# Patient Record
Sex: Female | Born: 1965 | Race: White | Hispanic: No | Marital: Married | Smoking: Former smoker
Health system: Southern US, Community
[De-identification: ages and names within clinical notes are randomized; demographics above are authoritative.]

## PROBLEM LIST (undated history)

## (undated) DIAGNOSIS — I82409 Acute embolism and thrombosis of unspecified deep veins of unspecified lower extremity: Secondary | ICD-10-CM

## (undated) DIAGNOSIS — K219 Gastro-esophageal reflux disease without esophagitis: Secondary | ICD-10-CM

## (undated) DIAGNOSIS — N200 Calculus of kidney: Secondary | ICD-10-CM

## (undated) DIAGNOSIS — F329 Major depressive disorder, single episode, unspecified: Secondary | ICD-10-CM

## (undated) DIAGNOSIS — G629 Polyneuropathy, unspecified: Secondary | ICD-10-CM

## (undated) DIAGNOSIS — R2689 Other abnormalities of gait and mobility: Secondary | ICD-10-CM

## (undated) DIAGNOSIS — R42 Dizziness and giddiness: Secondary | ICD-10-CM

## (undated) DIAGNOSIS — IMO0002 Reserved for concepts with insufficient information to code with codable children: Secondary | ICD-10-CM

## (undated) DIAGNOSIS — F419 Anxiety disorder, unspecified: Secondary | ICD-10-CM

## (undated) HISTORY — DX: Major depressive disorder, single episode, unspecified: F32.9

## (undated) HISTORY — PX: LITHOTRIPSY: SUR834

## (undated) HISTORY — DX: Calculus of kidney: N20.0

## (undated) HISTORY — DX: Reserved for concepts with insufficient information to code with codable children: IMO0002

## (undated) HISTORY — DX: Polyneuropathy, unspecified: G62.9

## (undated) HISTORY — DX: Gastro-esophageal reflux disease without esophagitis: K21.9

## (undated) HISTORY — DX: Anxiety disorder, unspecified: F41.9

---

## 2012-10-27 ENCOUNTER — Other Ambulatory Visit: Payer: Self-pay | Admitting: Family Medicine

## 2012-10-27 DIAGNOSIS — R1011 Right upper quadrant pain: Secondary | ICD-10-CM

## 2012-10-28 ENCOUNTER — Other Ambulatory Visit: Payer: Self-pay

## 2012-11-13 ENCOUNTER — Ambulatory Visit
Admission: RE | Admit: 2012-11-13 | Discharge: 2012-11-13 | Disposition: A | Discharge status: Home / Self Care | Payer: Self-pay | Source: Ambulatory Visit | Attending: Family Medicine | Admitting: Family Medicine

## 2012-11-13 DIAGNOSIS — R1011 Right upper quadrant pain: Secondary | ICD-10-CM

## 2013-01-27 ENCOUNTER — Other Ambulatory Visit (HOSPITAL_COMMUNITY)
Admission: RE | Admit: 2013-01-27 | Discharge: 2013-01-27 | Disposition: A | Discharge status: Home / Self Care | Payer: BC Managed Care – PPO | Source: Ambulatory Visit | Attending: Obstetrics and Gynecology | Admitting: Obstetrics and Gynecology

## 2013-01-27 ENCOUNTER — Other Ambulatory Visit: Payer: Self-pay | Admitting: Nurse Practitioner

## 2013-01-27 DIAGNOSIS — Z1151 Encounter for screening for human papillomavirus (HPV): Secondary | ICD-10-CM | POA: Insufficient documentation

## 2013-01-27 DIAGNOSIS — Z01419 Encounter for gynecological examination (general) (routine) without abnormal findings: Secondary | ICD-10-CM | POA: Insufficient documentation

## 2013-01-27 DIAGNOSIS — Z113 Encounter for screening for infections with a predominantly sexual mode of transmission: Secondary | ICD-10-CM | POA: Insufficient documentation

## 2013-01-29 ENCOUNTER — Other Ambulatory Visit: Payer: Self-pay

## 2013-01-29 DIAGNOSIS — Z1231 Encounter for screening mammogram for malignant neoplasm of breast: Secondary | ICD-10-CM

## 2013-03-11 ENCOUNTER — Ambulatory Visit
Admission: RE | Admit: 2013-03-11 | Discharge: 2013-03-11 | Disposition: A | Discharge status: Home / Self Care | Payer: BC Managed Care – PPO | Source: Ambulatory Visit

## 2013-03-11 DIAGNOSIS — Z1231 Encounter for screening mammogram for malignant neoplasm of breast: Secondary | ICD-10-CM

## 2013-04-20 ENCOUNTER — Encounter: Payer: Self-pay | Admitting: Neurology

## 2013-04-21 ENCOUNTER — Ambulatory Visit: Payer: BC Managed Care – PPO | Admitting: Neurology

## 2013-05-01 ENCOUNTER — Ambulatory Visit: Payer: BC Managed Care – PPO | Admitting: Neurology

## 2014-02-16 ENCOUNTER — Other Ambulatory Visit: Payer: Self-pay

## 2014-03-04 ENCOUNTER — Encounter (INDEPENDENT_AMBULATORY_CARE_PROVIDER_SITE_OTHER): Payer: Self-pay

## 2014-03-04 ENCOUNTER — Ambulatory Visit (INDEPENDENT_AMBULATORY_CARE_PROVIDER_SITE_OTHER): Payer: BC Managed Care – PPO | Admitting: Neurology

## 2014-03-04 ENCOUNTER — Encounter: Payer: Self-pay | Admitting: Neurology

## 2014-03-04 VITALS — BP 141/90 | HR 83 | Ht 65.0 in | Wt 159.0 lb

## 2014-03-04 DIAGNOSIS — R209 Unspecified disturbances of skin sensation: Secondary | ICD-10-CM

## 2014-03-04 DIAGNOSIS — R413 Other amnesia: Secondary | ICD-10-CM

## 2014-03-04 NOTE — Progress Notes (Signed)
Reason for visit: Numbness, weakness, memory problems  Kristin [name] is a 48 y.o. female  History of present illness:  [name] is a 48 year old left-handed white female with a history of major depression. The patient has required hospitalization in the past for her depression. Over the last several years, she believes that her depression has worsened over time. The patient recently was placed on Abilify, and she has been on Lamictal and Depakote. The patient has been on Depakote a number of years, and she has developed tremors associated with this. Within the last 6-12 months, she believes that there has been some issues with numbness and a sensation of weakness in arms and legs. The patient has numbness from the knees down to the feet, and in the hands. The patient feels that there has been some issues with balance, but she denies any falls. The patient has had issues with dropping things from her hands. The patient reports some joint pain, particularly in the knees. The patient denies problems controlling the bowels or the bladder. The patient does report some low back pain, and she also has some neck discomfort. The patient has had some issues with difficulty with concentration and confusion for several months. The patient has difficulty with directions while driving, and she has had problems keeping a full schedule while at work. The patient does report some issues with insomnia at times, and she has fatigue during the day. She is sent to this office for further evaluation. She reports that she has had recent blood work done for her annual blood work, no abnormalities were noted.  Past Medical History  Diagnosis Date  . Bilateral posterior subcapsular polar cataract   . Depression, major   . Anxiety   . GERD (gastroesophageal reflux disease)   . Renal calculi     Past Surgical History  Procedure Laterality Date  . Cesarean section    . Lithotripsy      Family History  Problem  Relation Age of Onset  . Depression Sister   . Cancer Sister     thyroid    Social history:  reports that she has never smoked. She has never used smokeless tobacco. She reports that she drinks alcohol. She reports that she does not use illicit drugs.  Medications:  Current Outpatient Prescriptions on File Prior to Visit  Medication Sig Dispense Refill  . clonazePAM (KLONOPIN) 0.5 MG tablet Take 0.5 mg by mouth 2 (two) times daily.       . divalproex (DEPAKOTE ER) 500 MG 24 hr tablet Take 1,000 mg by mouth daily.        No current facility-administered medications on file prior to visit.     No Known Allergies  ROS:  Out of a complete 14 system review of symptoms, the patient complains only of the following symptoms, and all other reviewed systems are negative.  Weight loss, fatigue Chest pains Ringing in the ears, difficulty swallowing Itching, moles Blurred vision Shortness of breath, cough Constipation Blood in the urine Feeling cold, joint pain Runny nose, skin sensitivity Confusion, headache, numbness, weakness, slurred speech, difficulty swallowing, dizziness, tremor Depression, anxiety, insomnia, decreased energy  Blood pressure 141/90, pulse 83, height 5\' 5"  (1.651 m), weight 159 lb (72.122 kg).  Physical Exam  General: The patient is alert and cooperative at the time of the examination.  Eyes: Pupils are equal, round, and reactive to light. Discs are flat bilaterally.  Neck: The neck is supple, no carotid bruits are  noted.  Respiratory: The respiratory examination is clear.  Cardiovascular: The cardiovascular examination reveals a regular rate and rhythm, no obvious murmurs or rubs are noted.  Skin: Extremities are without significant edema.  Neurologic Exam  Mental status: The patient is alert and oriented x 3 at the time of the examination. The patient has apparent normal recent and remote memory, with an apparently normal attention span and  concentration ability. Mini-Mental status examination done today shows a total score 29/30.  Cranial nerves: Facial symmetry is present. There is good sensation of the face to pinprick and soft touch bilaterally. The strength of the facial muscles and the muscles to head turning and shoulder shrug are normal bilaterally. Speech is well enunciated, no aphasia or dysarthria is noted. Extraocular movements are full. Visual fields are full. The tongue is midline, and the patient has symmetric elevation of the soft palate. No obvious hearing deficits are noted.  Motor: The motor testing reveals 5 over 5 strength of all 4 extremities. Good symmetric motor tone is noted throughout.  Sensory: Sensory testing is intact to pinprick, soft touch, vibration sensation, and position sense on all 4 extremities, with exception that there may be some slight decrease in position sense with the right foot. No definite stocking pattern pinprick sensory deficit was noted. No evidence of extinction is noted.  Coordination: Cerebellar testing reveals good finger-nose-finger and heel-to-shin bilaterally. A mild intention tremor seen with finger-nose-finger bilaterally.  Gait and station: Gait is normal. Tandem gait is normal. Romberg is negative. No drift is seen.  Reflexes: Deep tendon reflexes are symmetric and normal bilaterally. Toes are downgoing bilaterally.   Assessment/Plan:  One. Reported memory disturbance  2. Major depression  3. Reported numbness, weakness  4. Tremors, likely related to Depakote  The patient has a history of major depression, and she believes that her depression has worsened recently. The patient has some issues with memory and concentration, and it may be related to depression. The patient also reports some troubles with numbness and mild gait instability. We will pursue a further workup to include blood work and MRI of the brain to exclude demyelinating disease. The patient is on  Depakote, and the ammonia level does need to be checked for this reason. The patient will followup in 4 months.  Kristin [name]. Keith Willis MD 03/04/2014 10:35 AM  [facility] [address] [place], [place] [id number]  Phone [phone] Fax [phone]

## 2014-03-06 LAB — VALPROIC ACID LEVEL: Valproic Acid Lvl: 91 ug/mL (ref 50–100)

## 2014-03-06 LAB — HIV ANTIBODY (ROUTINE TESTING W REFLEX)
HIV 1/O/2 Abs-Index Value: <1 (ref <1.00)
HIV-1/HIV-2 Ab: NONREACTIVE

## 2014-03-06 LAB — LYME, TOTAL AB TEST/REFLEX: Lyme IgG/IgM Ab: <0.91 {ISR} (ref 0.00–0.90)

## 2014-03-06 LAB — ANGIOTENSIN CONVERTING ENZYME: Angio Convert Enzyme: 44 U/L (ref 14–82)

## 2014-03-06 LAB — AMMONIA: Ammonia: 56 ug/dL (ref 19–87)

## 2014-03-06 LAB — COPPER, SERUM: Copper: 117 ug/dL (ref 72–166)

## 2014-03-06 LAB — ANA W/REFLEX: Anti Nuclear Antibody(ANA): NEGATIVE

## 2014-03-06 LAB — VITAMIN B12: Vitamin B-12: 1353 pg/mL — ABNORMAL HIGH (ref 211–946)

## 2014-03-06 LAB — RHEUMATOID FACTOR: Rhuematoid fact SerPl-aCnc: 11.9 IU/mL (ref 0.0–13.9)

## 2014-03-06 LAB — SEDIMENTATION RATE: Sed Rate: 2 mm/hr (ref 0–32)

## 2014-03-08 NOTE — Progress Notes (Signed)
Quick Note:  Spoke with patient and shared unremarkable results, verbalized understanding and requested a copy mailed to her home address. Copy mailed ______

## 2014-03-23 ENCOUNTER — Other Ambulatory Visit: Payer: Self-pay

## 2014-03-23 DIAGNOSIS — Z1231 Encounter for screening mammogram for malignant neoplasm of breast: Secondary | ICD-10-CM

## 2014-04-06 ENCOUNTER — Ambulatory Visit: Payer: BC Managed Care – PPO

## 2014-04-08 ENCOUNTER — Other Ambulatory Visit: Payer: Self-pay | Admitting: Gastroenterology

## 2014-04-08 DIAGNOSIS — R1013 Epigastric pain: Secondary | ICD-10-CM

## 2014-04-08 DIAGNOSIS — R110 Nausea: Secondary | ICD-10-CM

## 2014-04-27 ENCOUNTER — Ambulatory Visit (HOSPITAL_COMMUNITY)
Admission: RE | Admit: 2014-04-27 | Discharge: 2014-04-27 | Disposition: A | Discharge status: Home / Self Care | Payer: BC Managed Care – PPO | Source: Ambulatory Visit | Attending: Gastroenterology | Admitting: Gastroenterology

## 2014-04-27 DIAGNOSIS — R1013 Epigastric pain: Secondary | ICD-10-CM | POA: Insufficient documentation

## 2014-04-27 DIAGNOSIS — R110 Nausea: Secondary | ICD-10-CM | POA: Insufficient documentation

## 2014-04-27 MED ORDER — TECHNETIUM TC 99M MEBROFENIN IV KIT: 5.0000 | PACK | Freq: Once | INTRAVENOUS | Status: AC | PRN

## 2014-04-27 MED ORDER — SINCALIDE 5 MCG IJ SOLR
0.0200 ug/kg | Freq: Once | INTRAMUSCULAR | Status: AC
  Administered 2014-04-27: 1.4 ug via INTRAVENOUS

## 2014-07-05 ENCOUNTER — Ambulatory Visit: Payer: BC Managed Care – PPO | Admitting: Nurse Practitioner

## 2014-07-07 ENCOUNTER — Ambulatory Visit: Payer: Self-pay | Admitting: Adult Health

## 2014-07-10 ENCOUNTER — Telehealth: Payer: Self-pay | Admitting: Gastroenterology

## 2014-07-10 NOTE — Telephone Encounter (Signed)
Had colonsocopy EGD with Dr. Loreta Ave yesterday.  Was given information on diverticulosis.  Has had nausea, abd pains yesterday.  Mid abd pain, tender to touch.  Felt a bit better with BM recently.  Doesn't feel feverish.  She did not receive any copies of her procedure.  She is very clear that her GI symptoms were present even before her procedures and were the reason she had the tests.    Very unlikely any complication from egd/colonoscopy. She will follow up with Dr. Loreta Ave next week or the week after.

## 2014-08-09 ENCOUNTER — Ambulatory Visit: Payer: BC Managed Care – PPO

## 2014-09-16 ENCOUNTER — Institutional Professional Consult (permissible substitution): Payer: BC Managed Care – PPO | Admitting: Emergency Medicine

## 2014-10-06 ENCOUNTER — Ambulatory Visit (INDEPENDENT_AMBULATORY_CARE_PROVIDER_SITE_OTHER): Payer: BC Managed Care – PPO | Admitting: Adult Health

## 2014-10-06 ENCOUNTER — Encounter: Payer: Self-pay | Admitting: Adult Health

## 2014-10-06 VITALS — BP 111/76 | HR 96 | Temp 97.7°F | Ht 65.0 in | Wt 146.0 lb

## 2014-10-06 DIAGNOSIS — R209 Unspecified disturbances of skin sensation: Secondary | ICD-10-CM

## 2014-10-06 DIAGNOSIS — R42 Dizziness and giddiness: Secondary | ICD-10-CM

## 2014-10-06 NOTE — Progress Notes (Signed)
PATIENT: Kristin [name] DOB: [birth year]  REASON FOR VISIT: follow up HISTORY FROM: patient  HISTORY OF PRESENT ILLNESS: Kristin [name] is a 48 year old female with a history of major depression, reported numbness and weakness and tremor. She returns today for follow-up. At her previous visit she had lab work which was unremarkable. An MRI of the brain was ordered for the patient however she has not had this done. She report that her PCP did additional blood work which was unremarkable. The patient reports that in the last two months she has begin to have dizziness. She notices it when she stands to walk. She notices it more when she changes positions but it has occurred when she is just  Sitting. She reports that she has been having some GI issues and therefore her appetite has not been great. She is unsure of her dizziness is due to that.  She also feels like she is "walking on clouds" at times. she also reports that she has had a pain in the back of her head that feels like a burning sensation. She reports that it has been constant has the day goes on. She feels like her muscles are weaker. She feels like if she stands for a period of time, her legs are going to give out if she doesn't sit down. Denies any falls. She is currently taking Depakote and Lamictal for her depression. She takes clonazepam for anxiety.                                         HISTORY 03/04/14 (WILLIS): 48 year old left-handed white female with a history of major depression. The patient has required hospitalization in the past for her depression. Over the last several years, she believes that her depression has worsened over time. The patient recently was placed on Abilify, and she has been on Lamictal and Depakote. The patient has been on Depakote a number of years, and she has developed tremors associated with this. Within the last 6-12 months, she believes that there has been some issues with numbness and a sensation of weakness in  arms and legs. The patient has numbness from the knees down to the feet, and in the hands. The patient feels that there has been some issues with balance, but she denies any falls. The patient has had issues with dropping things from her hands. The patient reports some joint pain, particularly in the knees. The patient denies problems controlling the bowels or the bladder. The patient does report some low back pain, and she also has some neck discomfort. The patient has had some issues with difficulty with concentration and confusion for several months. The patient has difficulty with directions while driving, and she has had problems keeping a full schedule while at work. The patient does report some issues with insomnia at times, and she has fatigue during the day. She is sent to this office for further evaluation. She reports that she has had recent blood work done for her annual blood work, no abnormalities were noted  REVIEW OF SYSTEMS: Out of a complete 14 system review of symptoms, the patient complains only of the following symptoms, and all other reviewed systems are negative.  Chills, fatigue, unexpected weight change Runny nose, trouble swallowing Eye itching Shortness of breath Palpitations Excessive thirst Abdominal pain, nausea Frequent waking, daytime sleepiness Frequency of urination, blood in urine Joint pain, back  pain, walking difficulty, neck pain, neck stiffness Moles, itching Dizziness, headache, numbness, speech difficulty, weakness, tremors Confusion, nervous/anxious  ALLERGIES: No Known Allergies  HOME MEDICATIONS: Outpatient Prescriptions Prior to Visit  Medication Sig Dispense Refill  . clonazePAM (KLONOPIN) 0.5 MG tablet Take 0.5 mg by mouth 2 (two) times daily.     . divalproex (DEPAKOTE ER) 500 MG 24 hr tablet Take 1,000 mg by mouth daily.     Marland Kitchen. lamoTRIgine (LAMICTAL) 100 MG tablet Take 150 mg by mouth daily.     . ARIPiprazole (ABILIFY) 10 MG tablet Take 10  mg by mouth daily.    Marland Kitchen. omeprazole (PRILOSEC) 20 MG capsule Take 20 mg by mouth daily.     No facility-administered medications prior to visit.    PAST MEDICAL HISTORY: Past Medical History  Diagnosis Date  . Bilateral posterior subcapsular polar cataract   . Depression, major   . Anxiety   . GERD (gastroesophageal reflux disease)   . Renal calculi   . Neuropathy     PAST SURGICAL HISTORY: Past Surgical History  Procedure Laterality Date  . Cesarean section    . Lithotripsy      FAMILY HISTORY: Family History  Problem Relation Age of Onset  . Depression Sister   . Cancer Sister     thyroid    SOCIAL HISTORY: History   Social History  . Marital Status: Divorced    Spouse Name: N/A    Number of Children: 1  . Years of Education: MA   Occupational History  . Teacher    Social History Main Topics  . Smoking status: Never Smoker   . Smokeless tobacco: Never Used  . Alcohol Use: Yes     Comment: rarely  . Drug Use: No  . Sexual Activity: Not on file   Other Topics Concern  . Not on file   Social History Narrative      PHYSICAL EXAM  Filed Vitals:   10/06/14 1030  BP: 111/76  Pulse: 96  Temp: 97.7 F (36.5 C)  TempSrc: Oral  Height: 5\' 5"  (1.651 m)  Weight: 146 lb (66.225 kg)   Body mass index is 24.3 kg/(m^2).  Generalized: Well developed, in no acute distress   Neurological examination  Mentation: Alert oriented to time, place, history taking. Follows all commands speech and language fluent Cranial nerve II-XII: Pupils were equal round reactive to light. Extraocular movements were full, visual field were full on confrontational test. Facial sensation and strength were normal. Uvula tongue midline. Head turning and shoulder shrug  were normal and symmetric. Motor: The motor testing reveals 5 over 5 strength of all 4 extremities. Good symmetric motor tone is noted throughout.  Sensory: Sensory testing is intact to soft touch on all 4  extremities. No evidence of extinction is noted.  Coordination: Cerebellar testing reveals good finger-nose-finger and heel-to-shin bilaterally.  Gait and station: Gait is normal. Tandem gait is normal. Romberg is negative. No drift is seen.  Reflexes: Deep tendon reflexes are symmetric and normal bilaterally.     DIAGNOSTIC DATA (LABS, IMAGING, TESTING) - I reviewed patient records, labs, notes, testing and imaging myself where available.   Lab Results  Component Value Date   VITAMINB12 1353* 03/04/2014      ASSESSMENT AND PLAN 48 y.o. year old female  has a past medical history of Bilateral posterior subcapsular polar cataract; Depression, major; Anxiety; GERD (gastroesophageal reflux disease); Renal calculi; and Neuropathy. here with:  1. Depression 2. Reported numbness and weakness  3. Dizziness  Patient returns today. She continues to have the numbness and weakness in the legs. However her physical exam is unremarkable. The patient is also having a new symptom of dizziness. She notices this primarily with position changes. I have advised the patient that she needs to get the MRI of the brain. Patient verbalized understanding. Once she has the MRI we will call her with the results. If the patient's symptoms worsen or she develops new symptoms she should let us know. Otherwise she'll follow up in 3 months.  Butch Penny, MSN, NP-C 10/06/2014, 10:47 AM [facility] [address], [address] [place], [place] [address] [phone]  Note: This document was prepared with digital dictation and possible smart phrase technology. Any transcriptional errors that result from this process are unintentional.

## 2014-10-06 NOTE — Progress Notes (Signed)
I have read the note, and I agree with the clinical assessment and plan.  Anagabriela Jokerst KEITH   

## 2014-10-06 NOTE — Patient Instructions (Signed)
Please call and scheduled your MRI of the brain. We will call you once the results are available to us.

## 2014-10-12 ENCOUNTER — Inpatient Hospital Stay: Admission: RE | Admit: 2014-10-12 | Payer: BC Managed Care – PPO | Source: Ambulatory Visit

## 2014-10-13 ENCOUNTER — Other Ambulatory Visit: Payer: Self-pay | Admitting: Family Medicine

## 2014-10-13 DIAGNOSIS — N644 Mastodynia: Secondary | ICD-10-CM

## 2014-11-02 ENCOUNTER — Ambulatory Visit
Admission: RE | Admit: 2014-11-02 | Discharge: 2014-11-02 | Disposition: A | Discharge status: Home / Self Care | Payer: BC Managed Care – PPO | Source: Ambulatory Visit | Attending: Family Medicine | Admitting: Family Medicine

## 2014-11-02 DIAGNOSIS — N644 Mastodynia: Secondary | ICD-10-CM

## 2014-11-03 ENCOUNTER — Ambulatory Visit (INDEPENDENT_AMBULATORY_CARE_PROVIDER_SITE_OTHER): Payer: BC Managed Care – PPO

## 2014-11-03 ENCOUNTER — Other Ambulatory Visit: Payer: BC Managed Care – PPO

## 2014-11-03 DIAGNOSIS — R413 Other amnesia: Secondary | ICD-10-CM

## 2014-11-03 DIAGNOSIS — R209 Unspecified disturbances of skin sensation: Secondary | ICD-10-CM

## 2014-11-03 MED ORDER — GADOPENTETATE DIMEGLUMINE 469.01 MG/ML IV SOLN: 13.0000 mL | Freq: Once | INTRAVENOUS | Status: AC | PRN

## 2014-11-06 ENCOUNTER — Telehealth: Payer: Self-pay | Admitting: Neurology

## 2014-11-06 NOTE — Telephone Encounter (Signed)
I called the patient. MRI of the brain is normal.   MRI brain 11/04/14:  IMPRESSION:  Normal MRI brain (with and without).

## 2014-11-16 ENCOUNTER — Telehealth: Payer: Self-pay | Admitting: Adult Health

## 2014-11-16 NOTE — Telephone Encounter (Signed)
Mri results

## 2014-11-16 NOTE — Telephone Encounter (Signed)
Patient calling for MRI results.  Please call and advise. °

## 2014-11-16 NOTE — Telephone Encounter (Signed)
Called patient no answer.

## 2014-11-16 NOTE — Telephone Encounter (Signed)
MRI of the brain was normal. Please call and let the patient know.

## 2014-11-17 ENCOUNTER — Encounter: Payer: Self-pay | Admitting: *Deleted

## 2015-01-06 ENCOUNTER — Ambulatory Visit: Payer: BC Managed Care – PPO | Admitting: Adult Health

## 2015-02-04 ENCOUNTER — Ambulatory Visit (INDEPENDENT_AMBULATORY_CARE_PROVIDER_SITE_OTHER): Payer: BC Managed Care – PPO | Admitting: Adult Health

## 2015-02-04 ENCOUNTER — Encounter: Payer: Self-pay | Admitting: Adult Health

## 2015-02-04 VITALS — BP 115/72 | HR 88 | Ht 65.0 in | Wt 140.0 lb

## 2015-02-04 DIAGNOSIS — R251 Tremor, unspecified: Secondary | ICD-10-CM

## 2015-02-04 DIAGNOSIS — F32A Depression, unspecified: Secondary | ICD-10-CM

## 2015-02-04 DIAGNOSIS — F329 Major depressive disorder, single episode, unspecified: Secondary | ICD-10-CM

## 2015-02-04 DIAGNOSIS — R200 Anesthesia of skin: Secondary | ICD-10-CM | POA: Diagnosis not present

## 2015-02-04 NOTE — Patient Instructions (Signed)
If numbness in feet worsen or you develop burning or tingling pain let us know.  Follow-up with PCP/ENT for sinus and dizziness

## 2015-02-04 NOTE — Progress Notes (Signed)
PATIENT: Kristin [name] DOB: [birth year]  REASON FOR VISIT: follow up HISTORY FROM: patient  HISTORY OF PRESENT ILLNESS: Kristin [name] is a 49 -year-old female with a history of major depression, numbness and weakness and tremor. She returns today for follow-up. The patient reports that she has numbness in the left foot that she mainly notices at night. She describes it as a sensation that her foot is swollen but it in fact is not swollen. She states that this it is intermittent. Denies any burning or tingling. She denies any numbness or burning or tingling in the right foot or leg. She states that she will occasionally have issues with the left hand when writing. She contributes this to a tremor that is from the Depakote. The patient is currently being followed by her primary care provider for sinus issues as well as shortness of breath and dizziness. The patient states this is been ongoing for 2-3 months. The patient plans to get a referral to ENT for evaluation. She denies any new neurological symptoms. She returns today for an evaluation.  HISTORY 10/06/14: KristinLevy is a 49 year old female with a history of major depression, reported numbness and weakness and tremor. She returns today for follow-up. At her previous visit she had lab work which was unremarkable. An MRI of the brain was ordered for the patient however she has not had this done. She report that her PCP did additional blood work which was unremarkable. The patient reports that in the last two months she has begin to have dizziness. She notices it when she stands to walk. She notices it more when she changes positions but it has occurred when she is just Sitting. She reports that she has been having some GI issues and therefore her appetite has not been great. She is unsure of her dizziness is due to that. She also feels like she is "walking on clouds" at times. she also reports that she has had a pain in the back of her head that feels  like a burning sensation. She reports that it has been constant has the day goes on. She feels like her muscles are weaker. She feels like if she stands for a period of time, her legs are going to give out if she doesn't sit down. Denies any falls. She is currently taking Depakote and Lamictal for her depression. She takes clonazepam for anxiety.    HISTORY 03/04/14 (WILLIS): 49 year old left-handed white female with a history of major depression. The patient has required hospitalization in the past for her depression. Over the last several years, she believes that her depression has worsened over time. The patient recently was placed on Abilify, and she has been on Lamictal and Depakote. The patient has been on Depakote a number of years, and she has developed tremors associated with this. Within the last 6-12 months, she believes that there has been some issues with numbness and a sensation of weakness in arms and legs. The patient has numbness from the knees down to the feet, and in the hands. The patient feels that there has been some issues with balance, but she denies any falls. The patient has had issues with dropping things from her hands. The patient reports some joint pain, particularly in the knees. The patient denies problems controlling the bowels or the bladder. The patient does report some low back pain, and she also has some neck discomfort. The patient has had some issues with difficulty with concentration and confusion for several  months. The patient has difficulty with directions while driving, and she has had problems keeping a full schedule while at work. The patient does report some issues with insomnia at times, and she has fatigue during the day. She is sent to this office for further evaluation. She reports that she has had recent blood work done for her annual blood work, no abnormalities were noted  REVIEW OF SYSTEMS: Out of a complete 14 system  review of symptoms, the patient complains only of the following symptoms, and all other reviewed systems are negative.  Appetite change, runny nose, trouble swallowing, drooling, cough, shortness of breath, chest tightness, chest pain, palpitations, daytime sleepiness, back pain, neck pain, neck stiffness, speech difficulty  ALLERGIES: No Known Allergies  HOME MEDICATIONS: Outpatient Prescriptions Prior to Visit  Medication Sig Dispense Refill  . buPROPion (WELLBUTRIN XL) 150 MG 24 hr tablet Take 150 mg by mouth daily.    . clonazePAM (KLONOPIN) 0.5 MG tablet Take 0.5 mg by mouth 2 (two) times daily.     . divalproex (DEPAKOTE ER) 500 MG 24 hr tablet Take 1,000 mg by mouth daily.     Marland Kitchen. docusate sodium (COLACE) 100 MG capsule Take 100 mg by mouth 2 (two) times daily.    Marland Kitchen. lamoTRIgine (LAMICTAL) 100 MG tablet Take 150 mg by mouth daily.     . ondansetron (ZOFRAN) 4 MG tablet Take 4 mg by mouth every 8 (eight) hours as needed for nausea or vomiting.    . ranitidine (ZANTAC) 150 MG tablet Take 150 mg by mouth 2 (two) times daily.    . ARIPiprazole (ABILIFY) 10 MG tablet Take 10 mg by mouth daily.    Marland Kitchen. omeprazole (PRILOSEC) 20 MG capsule Take 20 mg by mouth daily.     No facility-administered medications prior to visit.    PAST MEDICAL HISTORY: Past Medical History  Diagnosis Date  . Bilateral posterior subcapsular polar cataract   . Depression, major   . Anxiety   . GERD (gastroesophageal reflux disease)   . Renal calculi   . Neuropathy     PAST SURGICAL HISTORY: Past Surgical History  Procedure Laterality Date  . Cesarean section    . Lithotripsy      FAMILY HISTORY: Family History  Problem Relation Age of Onset  . Depression Sister   . Cancer Sister     thyroid    SOCIAL HISTORY: History   Social History  . Marital Status: Divorced    Spouse Name: N/A  . Number of Children: 1  . Years of Education: MA   Occupational History  . Teacher    Social History Main  Topics  . Smoking status: Never Smoker   . Smokeless tobacco: Never Used  . Alcohol Use: No     Comment: rarely  . Drug Use: No  . Sexual Activity: Not on file   Other Topics Concern  . Not on file   Social History Narrative      PHYSICAL EXAM  Filed Vitals:   02/04/15 0946  BP: 115/72  Pulse: 88  Height: 5\' 5"  (1.651 m)  Weight: 140 lb (63.504 kg)   Body mass index is 23.3 kg/(m^2).  Generalized: Well developed, in no acute distress   Neurological examination  Mentation: Alert oriented to time, place, history taking. Follows all commands speech and language fluent Cranial nerve II-XII: Pupils were equal round reactive to light. Extraocular movements were full, visual field were full on confrontational test. Facial sensation and strength were  normal. Uvula tongue midline. Head turning and shoulder shrug  were normal and symmetric. Motor: The motor testing reveals 5 over 5 strength of all 4 extremities. Good symmetric motor tone is noted throughout.  Sensory: Sensory testing is intact to soft touch on all 4 extremities. No evidence of extinction is noted.  Coordination: Cerebellar testing reveals good finger-nose-finger and heel-to-shin bilaterally.  Gait and station: Gait is normal. Tandem gait is normal. Romberg is negative. No drift is seen.  Reflexes: Deep tendon reflexes are symmetric and normal bilaterally.  Marland Kitchen   DIAGNOSTIC DATA (LABS, IMAGING, TESTING) - I reviewed patient records, labs, notes, testing and imaging myself where available.     ASSESSMENT AND PLAN 49 y.o. year old female  has a past medical history of Bilateral posterior subcapsular polar cataract; Depression, major; Anxiety; GERD (gastroesophageal reflux disease); Renal calculi; and Neuropathy. here with:  1. Numbness 2. Tremor 3. Depression  The patient reports intermittent numbness in the left foot noticed mainly at bedtime. The patient has had a thorough workup in the past that was  unremarkable. The numbness is most likely benign. She continues to have a tremor in the left hand was likely associated with Depakote. The patient continues to be treated with Depakote and Lamictal for depression. Patient advised that if the numbness increases or she develops burning and tingling sensations she should let us know. Otherwise the patient can follow up as needed.   Butch Penny, MSN, NP-C 02/04/2015, 9:55 AM [facility] [address], [address] [place], [place] [address] [phone]  Note: This document was prepared with digital dictation and possible smart phrase technology. Any transcriptional errors that result from this process are unintentional.

## 2015-02-04 NOTE — Progress Notes (Signed)
I have read the note, and I agree with the clinical assessment and plan.  Olivier Frayre KEITH   

## 2015-04-26 ENCOUNTER — Encounter (HOSPITAL_BASED_OUTPATIENT_CLINIC_OR_DEPARTMENT_OTHER): Payer: Self-pay | Admitting: *Deleted

## 2015-04-26 ENCOUNTER — Emergency Department (HOSPITAL_BASED_OUTPATIENT_CLINIC_OR_DEPARTMENT_OTHER)
Admission: EM | Admit: 2015-04-26 | Discharge: 2015-04-27 | Disposition: A | Discharge status: Home / Self Care | Payer: BC Managed Care – PPO | Attending: Emergency Medicine | Admitting: Emergency Medicine

## 2015-04-26 DIAGNOSIS — Z720 Tobacco use: Secondary | ICD-10-CM | POA: Diagnosis not present

## 2015-04-26 DIAGNOSIS — R200 Anesthesia of skin: Secondary | ICD-10-CM | POA: Diagnosis not present

## 2015-04-26 DIAGNOSIS — Z87442 Personal history of urinary calculi: Secondary | ICD-10-CM | POA: Diagnosis not present

## 2015-04-26 DIAGNOSIS — R2689 Other abnormalities of gait and mobility: Secondary | ICD-10-CM

## 2015-04-26 DIAGNOSIS — R29818 Other symptoms and signs involving the nervous system: Secondary | ICD-10-CM | POA: Diagnosis not present

## 2015-04-26 DIAGNOSIS — Z79899 Other long term (current) drug therapy: Secondary | ICD-10-CM | POA: Insufficient documentation

## 2015-04-26 DIAGNOSIS — F322 Major depressive disorder, single episode, severe without psychotic features: Secondary | ICD-10-CM | POA: Insufficient documentation

## 2015-04-26 DIAGNOSIS — K219 Gastro-esophageal reflux disease without esophagitis: Secondary | ICD-10-CM | POA: Diagnosis not present

## 2015-04-26 DIAGNOSIS — Z8669 Personal history of other diseases of the nervous system and sense organs: Secondary | ICD-10-CM | POA: Insufficient documentation

## 2015-04-26 DIAGNOSIS — F419 Anxiety disorder, unspecified: Secondary | ICD-10-CM | POA: Diagnosis not present

## 2015-04-26 DIAGNOSIS — R51 Headache: Secondary | ICD-10-CM | POA: Insufficient documentation

## 2015-04-26 DIAGNOSIS — R519 Headache, unspecified: Secondary | ICD-10-CM

## 2015-04-26 HISTORY — DX: Other abnormalities of gait and mobility: R26.89

## 2015-04-26 HISTORY — DX: Dizziness and giddiness: R42

## 2015-04-26 NOTE — ED Notes (Signed)
Pt c/o h/a x 6 hrs , numbness to face and bil arms

## 2015-04-27 NOTE — ED Provider Notes (Signed)
CSN: [id number]     Arrival date & time 04/26/15  2245 History   First MD Initiated Contact with Patient 04/27/15 0121     Chief Complaint  Patient presents with  . Headache     (Consider location/radiation/quality/duration/timing/severity/associated sxs/prior Treatment) Patient is a 49 y.o. female presenting with headaches. The history is provided by the patient.  Headache She has had some intermittent numbness today. She initially had numbness of her left forearm which lasted about one hour and then resolved. She then developed a headache which she has difficulty describing but she took some acetaminophen and the headache resolved. This was followed by numbness which involved both legs, left arm, and both sides of her face. That has now resolved. The numbness lasted several hours. She denies any weakness. She also describes progressive problems with balance over the last 2-3 months. That didn't seem to be a little bit worse today. She denies fever or chills. There's been no nausea or vomiting. She is completely symptom-free currently.  Past Medical History  Diagnosis Date  . Bilateral posterior subcapsular polar cataract   . Depression, major   . Anxiety   . GERD (gastroesophageal reflux disease)   . Renal calculi   . Neuropathy   . Dizziness   . Balance problem    Past Surgical History  Procedure Laterality Date  . Cesarean section    . Lithotripsy     Family History  Problem Relation Age of Onset  . Depression Sister   . Cancer Sister     thyroid   History  Substance Use Topics  . Smoking status: Current Some Day Smoker  . Smokeless tobacco: Never Used  . Alcohol Use: No     Comment: rarely   OB History    No data available     Review of Systems  Neurological: Positive for headaches.  All other systems reviewed and are negative.     Allergies  Review of patient's allergies indicates no known allergies.  Home Medications   Prior to Admission medications    Medication Sig Start Date End Date Taking? Authorizing Provider  ARIPiprazole (ABILIFY) 5 MG tablet Take 5 mg by mouth daily.   Yes Historical Provider, MD  clonazePAM (KLONOPIN) 0.5 MG tablet Take 1 mg by mouth 2 (two) times daily.     Historical Provider, MD  divalproex (DEPAKOTE ER) 500 MG 24 hr tablet Take 1,000 mg by mouth daily.     Historical Provider, MD  docusate sodium (COLACE) 100 MG capsule Take 100 mg by mouth 2 (two) times daily.    Historical Provider, MD  lamoTRIgine (LAMICTAL) 100 MG tablet Take 150 mg by mouth daily.  03/03/14   Historical Provider, MD  ondansetron (ZOFRAN) 4 MG tablet Take 4 mg by mouth every 8 (eight) hours as needed for nausea or vomiting.    Historical Provider, MD  ranitidine (ZANTAC) 150 MG tablet Take 150 mg by mouth 2 (two) times daily.    Historical Provider, MD   BP 122/79 mmHg  Pulse 72  Temp(Src) 97.8 F (36.6 C) (Oral)  Resp 16  Ht 5\' 5"  (1.651 m)  Wt 142 lb (64.411 kg)  BMI 23.63 kg/m2  SpO2 98% Physical Exam  Nursing note and vitals reviewed.  49 year old female, resting comfortably and in no acute distress. Vital signs are normal. Oxygen saturation is 98%, which is normal. Head is normocephalic and atraumatic. PERRLA, EOMI. Oropharynx is clear. Fundi show no hemorrhage, exudate, or papilledema. Neck  is nontender and supple without adenopathy or JVD. There are no carotid bruits. Back is nontender and there is no CVA tenderness. Lungs are clear without rales, wheezes, or rhonchi. Chest is nontender. Heart has regular rate and rhythm without murmur. Abdomen is soft, flat, nontender without masses or hepatosplenomegaly and peristalsis is normoactive. Extremities have no cyanosis or edema, full range of motion is present. Skin is warm and dry without rash. Neurologic: Mental status is normal, cranial nerves are intact. Strength is 5/5 in all muscle groups. There is slight subjective decreased sensation in both legs and the left forearm.  Romberg is normal. She is markedly off balance with attempts at tandem gait and tends to fall backwards.  ED Course  Procedures (including critical care time)  MDM   Final diagnoses:  Numbness  Nonintractable headache, unspecified chronicity pattern, unspecified headache type  Balance problem    Numbness which has resolved. Headache which has resolved. Old records are reviewed and she has been evaluated for numbness of the left foot with no findings found. She had negative MRI of the brain in December. Current symptoms seem to be an progression of the previous numbness. I'm also concerned about her developing balance problems. I am wondering about possibility of multiple sclerosis or similar condition. I do not see any indication for laboratory testing or CT scan today. She is referred back to her neurologist for further outpatient workup.    Dione Booze, MD 04/27/15 [phone]

## 2015-04-27 NOTE — Discharge Instructions (Signed)
Please make a follow-up appointment with your neurologist to evaluate your numbness and problems with balance.

## 2015-04-27 NOTE — ED Notes (Signed)
MD at bedside. 

## 2015-05-03 ENCOUNTER — Encounter: Payer: Self-pay | Admitting: Neurology

## 2015-05-03 ENCOUNTER — Ambulatory Visit (INDEPENDENT_AMBULATORY_CARE_PROVIDER_SITE_OTHER): Payer: BC Managed Care – PPO | Admitting: Neurology

## 2015-05-03 VITALS — BP 127/79 | HR 83 | Ht 65.0 in | Wt 151.2 lb

## 2015-05-03 DIAGNOSIS — R413 Other amnesia: Secondary | ICD-10-CM | POA: Diagnosis not present

## 2015-05-03 DIAGNOSIS — R209 Unspecified disturbances of skin sensation: Secondary | ICD-10-CM

## 2015-05-03 MED ORDER — VERAPAMIL HCL ER 120 MG PO TBCR: 120.0000 mg | EXTENDED_RELEASE_TABLET | Freq: Every day | ORAL | Status: DC

## 2015-05-03 NOTE — Patient Instructions (Signed)

## 2015-05-03 NOTE — Progress Notes (Signed)
Reason for visit: Headache  Kristin [name] is an 49 y.o. female  History of present illness:  Kristin [name] is a 49 year old left-handed white female with a history of headaches and sensory paresthesias. The patient went to the emergency room on 04/27/2015 with onset of left and then right arm paresthesias. The patient had facial numbness as well associated with the headache. The patient indicated her symptoms lasted several hours and then improved. She noted that her balance was also off during this episode. 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 She may have photophobia and phonophobia with the headache and any movement in her visual field may worsen the symptoms. The patient occasionally will have a burning sensation in the head. She returns to this office for an evaluation. Previously, MRI of the brain was unremarkable.  Past Medical History  Diagnosis Date  . Bilateral posterior subcapsular polar cataract   . Depression, major   . Anxiety   . GERD (gastroesophageal reflux disease)   . Renal calculi   . Neuropathy   . Dizziness   . Balance problem     Past Surgical History  Procedure Laterality Date  . Cesarean section    . Lithotripsy      Family History  Problem Relation Age of Onset  . Depression Sister   . Cancer Sister     thyroid  . Multiple sclerosis Cousin     Social history:  reports that she has quit smoking. She has never used smokeless tobacco. She reports that she does not drink alcohol or use illicit drugs.   No Known Allergies  Medications:  Prior to Admission medications   Medication Sig Start Date End Date Taking? Authorizing Provider  ARIPiprazole (ABILIFY) 5 MG tablet Take 5 mg by mouth daily.   Yes Historical Provider, MD  Calcium-Magnesium-Vitamin D (CALCIUM 500 PO) Take 1 capsule by mouth daily.   Yes Historical  Provider, MD  clonazePAM (KLONOPIN) 0.5 MG tablet Take 1 mg by mouth 2 (two) times daily.    Yes Historical Provider, MD  divalproex (DEPAKOTE ER) 500 MG 24 hr tablet Take 1,000 mg by mouth daily.    Yes Historical Provider, MD  docusate sodium (COLACE) 100 MG capsule Take 100 mg by mouth 2 (two) times daily.   Yes Historical Provider, MD  lamoTRIgine (LAMICTAL) 100 MG tablet Take 150 mg by mouth daily.  03/03/14  Yes Historical Provider, MD  loratadine (CLARITIN) 10 MG tablet Take 10 mg by mouth daily.   Yes Historical Provider, MD  Multiple Vitamin (MULTIVITAMIN) tablet Take 1 tablet by mouth daily.   Yes Historical Provider, MD  ondansetron (ZOFRAN) 4 MG tablet Take 4 mg by mouth every 8 (eight) hours as needed for nausea or vomiting.   Yes Historical Provider, MD  ranitidine (ZANTAC) 150 MG tablet Take 150 mg by mouth 2 (two) times daily.   Yes Historical Provider, MD  vitamin C (ASCORBIC ACID) 250 MG tablet Take 250 mg by mouth daily.   Yes Historical Provider, MD    ROS:  Out of a complete 14 system review of symptoms, the patient complains only of the following symptoms, and all other reviewed systems are negative.  Light sensitivity, blurred vision Cold intolerance, excessive thirst Joint pain, walking difficulty, neck pain, neck stiffness Dizziness, headache, numbness, weakness Confusion  Blood pressure  127/79, pulse 83, height 5\' 5"  (1.651 m), weight 151 lb 3.2 oz (68.584 kg).  Physical Exam  General: The patient is alert and cooperative at the time of the examination.  Skin: No significant peripheral edema is noted.   Neurologic Exam  Mental status: The patient is alert and oriented x 3 at the time of the examination. The patient has apparent normal recent and remote memory, with an apparently normal attention span and concentration ability.   Cranial nerves: Facial symmetry is present. Speech is normal, no aphasia or dysarthria is noted. Extraocular movements are full.  Visual fields are full.  Motor: The patient has good strength in all 4 extremities.  Sensory examination: Soft touch sensation is symmetric on the face, arms, and legs.  Coordination: The patient has good finger-nose-finger and heel-to-shin bilaterally.  Gait and station: The patient has a normal gait. Tandem gait is normal. Romberg is negative. No drift is seen.  Reflexes: Deep tendon reflexes are symmetric.   MRI brain 11/04/14:  IMPRESSION:  Normal MRI brain (with and without).   Assessment/Plan:  1. Paresthesias  2. Intermittent headache  The patient likely has migraine type headaches that are associated with the sensory symptoms. She will be placed on verapamil for the headaches. She believes that she has been on Topamax before and may not have tolerated the drug. She will follow-up in 3-4 months.  Marlan Palau MD 05/03/2015 8:26 PM  [facility] [address] [place], [place] [address]  Phone [phone] Fax [phone]

## 2015-08-08 ENCOUNTER — Emergency Department (HOSPITAL_COMMUNITY)
Admission: EM | Admit: 2015-08-08 | Discharge: 2015-08-08 | Disposition: A | Discharge status: Home / Self Care | Payer: BC Managed Care – PPO | Attending: Emergency Medicine | Admitting: Emergency Medicine

## 2015-08-08 ENCOUNTER — Encounter (HOSPITAL_COMMUNITY): Payer: Self-pay | Admitting: Emergency Medicine

## 2015-08-08 DIAGNOSIS — Z79899 Other long term (current) drug therapy: Secondary | ICD-10-CM | POA: Diagnosis not present

## 2015-08-08 DIAGNOSIS — Z87442 Personal history of urinary calculi: Secondary | ICD-10-CM | POA: Diagnosis not present

## 2015-08-08 DIAGNOSIS — L03115 Cellulitis of right lower limb: Secondary | ICD-10-CM | POA: Insufficient documentation

## 2015-08-08 DIAGNOSIS — F419 Anxiety disorder, unspecified: Secondary | ICD-10-CM | POA: Diagnosis not present

## 2015-08-08 DIAGNOSIS — Z8669 Personal history of other diseases of the nervous system and sense organs: Secondary | ICD-10-CM | POA: Insufficient documentation

## 2015-08-08 DIAGNOSIS — Z87891 Personal history of nicotine dependence: Secondary | ICD-10-CM | POA: Insufficient documentation

## 2015-08-08 DIAGNOSIS — K219 Gastro-esophageal reflux disease without esophagitis: Secondary | ICD-10-CM | POA: Insufficient documentation

## 2015-08-08 DIAGNOSIS — F329 Major depressive disorder, single episode, unspecified: Secondary | ICD-10-CM | POA: Insufficient documentation

## 2015-08-08 MED ORDER — SULFAMETHOXAZOLE-TRIMETHOPRIM 800-160 MG PO TABS: 1.0000 | ORAL_TABLET | Freq: Two times a day (BID) | ORAL | Status: AC

## 2015-08-08 MED ORDER — CEPHALEXIN 500 MG PO CAPS: 500.0000 mg | ORAL_CAPSULE | Freq: Four times a day (QID) | ORAL | Status: DC

## 2015-08-08 MED ORDER — VANCOMYCIN HCL 10 G IV SOLR
1250.0000 mg | Freq: Once | INTRAVENOUS | Status: DC
  Filled 2015-08-08: qty 1250

## 2015-08-08 NOTE — Discharge Instructions (Signed)

## 2015-08-08 NOTE — ED Notes (Signed)
Pt states she was diagnosed with cellulitis of the right lower leg this past Wednesday and was given an antibiotic shot and started on doxycycline  Pt states today she noticed her leg has more redness and has become more painful  The area was marked by her doctor and the redness extends beyond the marks today

## 2015-08-19 NOTE — ED Provider Notes (Signed)
CSN: [id number]     Arrival date & time 08/08/15  2031 History   First MD Initiated Contact with Patient 08/08/15 2123     Chief Complaint  Patient presents with  . Cellulitis     (Consider location/radiation/quality/duration/timing/severity/associated sxs/prior Treatment) HPI   49 year old female with right lower external knee pain. Patient was diagnosed with cellulitis this past Wednesday. She was started on doxycycline which she reports compliance with. Today she began having local bit more pain and feels like the redness has increased in area as well. No fevers or chills. No nausea or vomiting. No history of diabetes, chronic steroid use or other immunocompromising state.  Past Medical History  Diagnosis Date  . Bilateral posterior subcapsular polar cataract   . Depression, major   . Anxiety   . GERD (gastroesophageal reflux disease)   . Renal calculi   . Neuropathy   . Dizziness   . Balance problem    Past Surgical History  Procedure Laterality Date  . Cesarean section    . Lithotripsy     Family History  Problem Relation Age of Onset  . Depression Sister   . Cancer Sister     thyroid  . Multiple sclerosis Cousin    Social History  Substance Use Topics  . Smoking status: Former Games developer  . Smokeless tobacco: Never Used  . Alcohol Use: No     Comment: rarely   OB History    No data available     Review of Systems  All systems reviewed and negative, other than as noted in HPI.   Allergies  Review of patient's allergies indicates no known allergies.  Home Medications   Prior to Admission medications   Medication Sig Start Date End Date Taking? Authorizing Provider  ARIPiprazole (ABILIFY) 5 MG tablet Take 5 mg by mouth daily.   Yes Historical Provider, MD  buPROPion (WELLBUTRIN XL) 150 MG 24 hr tablet Take 150 mg by mouth daily.   Yes Historical Provider, MD  Calcium-Magnesium-Vitamin D (CALCIUM 500 PO) Take 1 capsule by mouth daily.   Yes Historical  Provider, MD  clonazePAM (KLONOPIN) 0.5 MG tablet Take 0.5-0.75 mg by mouth 2 (two) times daily as needed for anxiety.    Yes Historical Provider, MD  divalproex (DEPAKOTE ER) 500 MG 24 hr tablet Take 1,000 mg by mouth daily.    Yes Historical Provider, MD  docusate sodium (COLACE) 100 MG capsule Take 100 mg by mouth daily as needed for mild constipation or moderate constipation.    Yes Historical Provider, MD  lamoTRIgine (LAMICTAL) 100 MG tablet Take 150 mg by mouth daily.  03/03/14  Yes Historical Provider, MD  loratadine (CLARITIN) 10 MG tablet Take 10 mg by mouth daily as needed for allergies.    Yes Historical Provider, MD  Multiple Vitamin (MULTIVITAMIN) tablet Take 1 tablet by mouth daily.   Yes Historical Provider, MD  ondansetron (ZOFRAN) 4 MG tablet Take 4 mg by mouth every 8 (eight) hours as needed for nausea or vomiting.   Yes Historical Provider, MD  ranitidine (ZANTAC) 150 MG tablet Take 150 mg by mouth 2 (two) times daily as needed for heartburn.    Yes Historical Provider, MD  vitamin C (ASCORBIC ACID) 250 MG tablet Take 250 mg by mouth daily.   Yes Historical Provider, MD  cephALEXin (KEFLEX) 500 MG capsule Take 1 capsule (500 mg total) by mouth 4 (four) times daily. 08/08/15   Raeford Razor, MD  verapamil (CALAN-SR) 120 MG CR tablet  Take 1 tablet (120 mg total) by mouth at bedtime. Patient not taking: Reported on 08/08/2015 05/03/15   York Spaniel, MD   BP 131/82 mmHg  Pulse 72  Temp(Src) 98 F (36.7 C) (Oral)  Resp 16  SpO2 100% Physical Exam  Constitutional: She appears well-developed and well-nourished. No distress.  HENT:  Head: Normocephalic and atraumatic.  Eyes: Conjunctivae are normal. Right eye exhibits no discharge. Left eye exhibits no discharge.  Neck: Neck supple.  Cardiovascular: Normal rate, regular rhythm and normal heart sounds.  Exam reveals no gallop and no friction rub.   No murmur heard. Pulmonary/Chest: Effort normal and breath sounds normal. No  respiratory distress.  Abdominal: Soft. She exhibits no distension. There is no tenderness.  Musculoskeletal: She exhibits no edema or tenderness.  Neurological: She is alert.  Skin: Skin is warm and dry.  Erythema, increased warmth and some induration consistent with cellulitis to right lower extremity from approximately the ankle to the proximal shin. Not circumferential. No evidence of abscess. Able to actively range of ankle and knee without anyapparent pain.    Psychiatric: She has a normal mood and affect. Her behavior is normal. Thought content normal.  Nursing note and vitals reviewed.   ED Course  Procedures (including critical care time) Labs Review Labs Reviewed - No data to display  Imaging Review No results found. I have personally reviewed and evaluated these images and lab results as part of my medical decision-making.   EKG Interpretation None      MDM   Final diagnoses:  Cellulitis of right lower extremity    49 year old female with cellulitis or right lower extremity. she's been on antibiotics. Symptoms have not progressed, although they have not improved either despite a few days of antibiotics. Will add additional coverage. She is not systemically ill. Do not feel that she requires inpatient treatment at this time. She may required if she does not respond to this though. Return precautions were discussed.  Raeford Razor, MD 08/19/15 2330

## 2015-08-30 ENCOUNTER — Telehealth: Payer: Self-pay

## 2015-08-30 NOTE — Telephone Encounter (Signed)
Called pt to offer earlier appt w/ NP-Megan Millikan. No answer.

## 2015-09-08 ENCOUNTER — Ambulatory Visit: Payer: BC Managed Care – PPO | Admitting: Neurology

## 2016-04-20 ENCOUNTER — Other Ambulatory Visit: Payer: Self-pay | Admitting: Physician Assistant

## 2016-04-20 DIAGNOSIS — Z1231 Encounter for screening mammogram for malignant neoplasm of breast: Secondary | ICD-10-CM

## 2016-05-01 ENCOUNTER — Ambulatory Visit
Admission: RE | Admit: 2016-05-01 | Discharge: 2016-05-01 | Disposition: A | Discharge status: Home / Self Care | Payer: BC Managed Care – PPO | Source: Ambulatory Visit | Attending: Physician Assistant | Admitting: Physician Assistant

## 2016-05-01 DIAGNOSIS — Z1231 Encounter for screening mammogram for malignant neoplasm of breast: Secondary | ICD-10-CM

## 2017-04-25 DIAGNOSIS — N3000 Acute cystitis without hematuria: Secondary | ICD-10-CM | POA: Diagnosis not present

## 2017-04-25 DIAGNOSIS — R202 Paresthesia of skin: Secondary | ICD-10-CM | POA: Diagnosis not present

## 2017-04-25 DIAGNOSIS — R1013 Epigastric pain: Secondary | ICD-10-CM | POA: Insufficient documentation

## 2017-04-25 DIAGNOSIS — F17290 Nicotine dependence, other tobacco product, uncomplicated: Secondary | ICD-10-CM | POA: Insufficient documentation

## 2017-04-25 DIAGNOSIS — Z79899 Other long term (current) drug therapy: Secondary | ICD-10-CM | POA: Diagnosis not present

## 2017-04-25 DIAGNOSIS — R42 Dizziness and giddiness: Secondary | ICD-10-CM | POA: Diagnosis present

## 2017-04-26 ENCOUNTER — Emergency Department (HOSPITAL_COMMUNITY): Payer: Managed Care, Other (non HMO)

## 2017-04-26 ENCOUNTER — Emergency Department (HOSPITAL_COMMUNITY)
Admission: EM | Admit: 2017-04-26 | Discharge: 2017-04-26 | Disposition: A | Discharge status: Home / Self Care | Payer: Managed Care, Other (non HMO) | Attending: Emergency Medicine | Admitting: Emergency Medicine

## 2017-04-26 ENCOUNTER — Encounter (HOSPITAL_COMMUNITY): Payer: Self-pay | Admitting: Family Medicine

## 2017-04-26 DIAGNOSIS — R1013 Epigastric pain: Secondary | ICD-10-CM

## 2017-04-26 DIAGNOSIS — R42 Dizziness and giddiness: Secondary | ICD-10-CM

## 2017-04-26 DIAGNOSIS — N3000 Acute cystitis without hematuria: Secondary | ICD-10-CM

## 2017-04-26 DIAGNOSIS — R200 Anesthesia of skin: Secondary | ICD-10-CM

## 2017-04-26 HISTORY — DX: Acute embolism and thrombosis of unspecified deep veins of unspecified lower extremity: I82.409

## 2017-04-26 LAB — CBG MONITORING, ED: Glucose-Capillary: 98 mg/dL (ref 65–99)

## 2017-04-26 LAB — URINALYSIS, ROUTINE W REFLEX MICROSCOPIC
Bilirubin Urine: NEGATIVE
Glucose, UA: NEGATIVE mg/dL
Hgb urine dipstick: NEGATIVE
Ketones, ur: NEGATIVE mg/dL
Nitrite: NEGATIVE
Protein, ur: NEGATIVE mg/dL
Specific Gravity, Urine: 1.004 — ABNORMAL LOW (ref 1.005–1.030)
pH: 6 (ref 5.0–8.0)

## 2017-04-26 LAB — CBC
HCT: 42.5 % (ref 36.0–46.0)
Hemoglobin: 15.3 g/dL — ABNORMAL HIGH (ref 12.0–15.0)
MCH: 32.6 pg (ref 26.0–34.0)
MCHC: 36 g/dL (ref 30.0–36.0)
MCV: 90.6 fL (ref 78.0–100.0)
Platelets: 243 10*3/uL (ref 150–400)
RBC: 4.69 MIL/uL (ref 3.87–5.11)
RDW: 12.4 % (ref 11.5–15.5)
WBC: 8.4 10*3/uL (ref 4.0–10.5)

## 2017-04-26 LAB — BASIC METABOLIC PANEL
Anion gap: 9 (ref 5–15)
BUN: 12 mg/dL (ref 6–20)
CO2: 23 mmol/L (ref 22–32)
Calcium: 9.8 mg/dL (ref 8.9–10.3)
Chloride: 106 mmol/L (ref 101–111)
Creatinine, Ser: 0.86 mg/dL (ref 0.44–1.00)
GFR calc Af Amer: >60 mL/min (ref >60)
GFR calc non Af Amer: >60 mL/min (ref >60)
Glucose, Bld: 101 mg/dL — ABNORMAL HIGH (ref 65–99)
Potassium: 3.8 mmol/L (ref 3.5–5.1)
Sodium: 138 mmol/L (ref 135–145)

## 2017-04-26 LAB — HEPATIC FUNCTION PANEL
ALT: 20 U/L (ref 14–54)
AST: 20 U/L (ref 15–41)
Albumin: 3.9 g/dL (ref 3.5–5.0)
Alkaline Phosphatase: 87 U/L (ref 38–126)
Bilirubin, Direct: <0.1 mg/dL — ABNORMAL LOW (ref 0.1–0.5)
Total Bilirubin: 0.4 mg/dL (ref 0.3–1.2)
Total Protein: 6.6 g/dL (ref 6.5–8.1)

## 2017-04-26 LAB — TROPONIN I: Troponin I: <0.03 ng/mL (ref <0.03)

## 2017-04-26 LAB — LIPASE, BLOOD: Lipase: 33 U/L (ref 11–51)

## 2017-04-26 MED ORDER — CEPHALEXIN 500 MG PO CAPS
500.0000 mg | ORAL_CAPSULE | Freq: Once | ORAL | Status: AC
  Administered 2017-04-26: 500 mg via ORAL
  Filled 2017-04-26: qty 1

## 2017-04-26 MED ORDER — CEPHALEXIN 500 MG PO CAPS: 500.0000 mg | ORAL_CAPSULE | Freq: Three times a day (TID) | ORAL | 0 refills | Status: DC

## 2017-04-26 NOTE — ED Provider Notes (Signed)
[facility]-EMERGENCY DEPT Provider Note   CSN: [id number] Arrival date & time: 04/25/17  2338  By signing my name below, I, Cynda AcresHailei Fulton, attest that this documentation has been prepared under the direction and in the presence of Devoria AlbeKnapp, Haley Roza, MD. Electronically Signed: Cynda AcresHailei Fulton, Scribe. 04/26/17. 2:20 AM.  Time seen 02:05 AM  History   Chief Complaint Chief Complaint  Patient presents with  . Dizziness  . Numbness    HPI Comments: Kristin [name] is a 51 y.o. female with no pertinent past medical history, who presents to the Emergency Department complaining of sudden-onset right forearm and right hand numbness that began at 9 pm tonight that resolved by 1 AM . Patient states she was visiting her friends when she began having epigastric abdominal pain and the numbness. Patient states when she returned home at 10 pm her symptoms worsened. Patient denies having trouble ambulating or gripping items. Patient states her numbness is resolved, the numbness lasted 4 hours. Patient states she has had intermittent similar pain to her abdomen for several years now, she was seen by a gastroenterologist who diagnosed her with diverticulosis. Patient reports having similar right hand/arm numbness 6 months ago, she was not evaluated for this, but states it lasted a few hours. Patient reports eating boiled chicken with potatoes for dinner tonight. Patient reports an associated aching/pressured headache that started in the ED and is on her left side and dizziness. Patient reports taking 325 mg of aspirin prior to arrival. Patient is left hand dominant. Patient states she has a family history of stroke (1 paternal aunt). Patient does smoke electronic cigarettes for the past 2 years, but denies cigarette use. Patient denies any nausea, vomiting, diarrhea, blurred vision, chest pain, neck pain,  shortness of breath, or any additional symptoms.   The history is provided by the patient. No language interpreter was used.     PCP Couillard, Victorino DikeJennifer, PA-C  GI Dr [facility]   Past Medical History:  Diagnosis Date  . Anxiety   . Balance problem   . Bilateral posterior subcapsular polar cataract   . Depression, major   . Dizziness   . DVT (deep venous thrombosis) (HCC)   . GERD (gastroesophageal reflux disease)   . Neuropathy   . Renal calculi     Patient Active Problem List   Diagnosis Date Noted  . Disturbance of skin sensation 03/04/2014  . Memory change 03/04/2014    Past Surgical History:  Procedure Laterality Date  . CESAREAN SECTION    . LITHOTRIPSY      OB History    No data available       Home Medications    Prior to Admission medications   Medication Sig Start Date End Date Taking? Authorizing Provider  ARIPiprazole (ABILIFY) 5 MG tablet Take 5 mg by mouth daily.   Yes [provider]  aspirin 325 MG tablet Take 325 mg by mouth daily.   Yes [provider]  buPROPion (WELLBUTRIN XL) 300 MG 24 hr tablet Take 1 tablet by mouth daily. 02/26/17  Yes [provider]  Calcium-Magnesium-Vitamin D (CALCIUM 500 PO) Take 1 capsule by mouth daily.   Yes [provider]  clonazePAM (KLONOPIN) 0.5 MG tablet Take 0.5-0.75 mg by mouth 2 (two) times daily as needed for anxiety.    Yes [provider]  divalproex (DEPAKOTE ER) 500 MG 24 hr tablet Take 500 mg by mouth daily.    Yes [provider]  docusate sodium (COLACE) 100 MG capsule  Take 100 mg by mouth daily as needed for mild constipation or moderate constipation.    Yes [provider]  lamoTRIgine (LAMICTAL) 100 MG tablet Take 150 mg by mouth daily.  03/03/14  Yes [provider]  Multiple Vitamin (MULTIVITAMIN) tablet Take 1 tablet by mouth daily.   Yes [provider]  vitamin B-12 (CYANOCOBALAMIN) 100 MCG tablet Take 100 mcg by mouth daily.   Yes [provider]  cephALEXin (KEFLEX) 500 MG capsule Take 1 capsule (500 mg total) by mouth 3 (three) times  daily. 04/26/17   Devoria Albe, MD  verapamil (CALAN-SR) 120 MG CR tablet Take 1 tablet (120 mg total) by mouth at bedtime. Patient not taking: Reported on 08/08/2015 05/03/15   York Spaniel, MD  vitamin C (ASCORBIC ACID) 250 MG tablet Take 250 mg by mouth daily.    [provider]    Family History Family History  Problem Relation Age of Onset  . Depression Sister   . Cancer Sister        thyroid  . Multiple sclerosis Cousin     Social History Social History  Substance Use Topics  . Smoking status: Former Games developer  . Smokeless tobacco: Never Used  . Alcohol use Yes     Comment: Two drinks about every 2-4 weeks   employed (middle school teacher) Smokes e cigarettes denies every smoking cigarettes   Allergies   Patient has no known allergies.   Review of Systems Review of Systems  Constitutional: Negative for chills and fever.  Eyes: Negative for visual disturbance.  Respiratory: Negative for shortness of breath.   Cardiovascular: Negative for chest pain.  Gastrointestinal: Positive for abdominal pain. Negative for nausea and vomiting.  Neurological: Positive for dizziness, numbness (right hand and right arm) and headaches.  All other systems reviewed and are negative.    Physical Exam Updated Vital Signs BP 140/80 (BP Location: Right Arm)   Pulse 84   Temp 98.1 F (36.7 C) (Oral)   Resp 18   Ht 5\' 5"  (1.651 m)   Wt 155 lb (70.3 kg)   SpO2 99%   BMI 25.79 kg/m   Vital signs normal    Physical Exam  Constitutional: She is oriented to person, place, and time. She appears well-developed and well-nourished.  Non-toxic appearance. She does not appear ill. No distress.  HENT:  Head: Normocephalic and atraumatic.  Right Ear: External ear normal.  Left Ear: External ear normal.  Nose: Nose normal. No mucosal edema or rhinorrhea.  Mouth/Throat: Oropharynx is clear and moist and mucous membranes are normal. No dental abscesses or uvula swelling.  Eyes:  Conjunctivae and EOM are normal. Pupils are equal, round, and reactive to light.  Neck: Normal range of motion and full passive range of motion without pain. Neck supple.  Cardiovascular: Normal rate, regular rhythm and normal heart sounds.  Exam reveals no gallop and no friction rub.   No murmur heard. Pulmonary/Chest: Effort normal and breath sounds normal. No respiratory distress. She has no wheezes. She has no rhonchi. She has no rales. She exhibits no tenderness and no crepitus.  Abdominal: Soft. Normal appearance and bowel sounds are normal. She exhibits no distension. There is tenderness in the epigastric area. There is no rebound and no guarding.    Musculoskeletal: Normal range of motion. She exhibits no edema or tenderness.  Moves all extremities well.   Neurological: She is alert and oriented to person, place, and time. She has normal strength. No  cranial nerve deficit.  Grips are equal. No pronator drift. No motor weakness. No loss of sensation to light touch in either upper extremity.   Skin: Skin is warm, dry and intact. No rash noted. No erythema. No pallor.  Psychiatric: She has a normal mood and affect. Her speech is normal and behavior is normal. Her mood appears not anxious.  Nursing note and vitals reviewed.    ED Treatments / Results   Results for orders placed or performed during the hospital encounter of 04/26/17  Basic metabolic panel  Result Value Ref Range   Sodium 138 135 - 145 mmol/L   Potassium 3.8 3.5 - 5.1 mmol/L   Chloride 106 101 - 111 mmol/L   CO2 23 22 - 32 mmol/L   Glucose, Bld 101 (H) 65 - 99 mg/dL   BUN 12 6 - 20 mg/dL   Creatinine, Ser 4.09 0.44 - 1.00 mg/dL   Calcium 9.8 8.9 - 81.1 mg/dL   GFR calc non Af Amer >60 >60 mL/min   GFR calc Af Amer >60 >60 mL/min   Anion gap 9 5 - 15  CBC  Result Value Ref Range   WBC 8.4 4.0 - 10.5 K/uL   RBC 4.69 3.87 - 5.11 MIL/uL   Hemoglobin 15.3 (H) 12.0 - 15.0 g/dL   HCT 91.4 78.2 - 95.6 %   MCV 90.6  78.0 - 100.0 fL   MCH 32.6 26.0 - 34.0 pg   MCHC 36.0 30.0 - 36.0 g/dL   RDW 21.3 08.6 - 57.8 %   Platelets 243 150 - 400 K/uL  Urinalysis, Routine w reflex microscopic  Result Value Ref Range   Color, Urine STRAW (A) YELLOW   APPearance CLEAR CLEAR   Specific Gravity, Urine 1.004 (L) 1.005 - 1.030   pH 6.0 5.0 - 8.0   Glucose, UA NEGATIVE NEGATIVE mg/dL   Hgb urine dipstick NEGATIVE NEGATIVE   Bilirubin Urine NEGATIVE NEGATIVE   Ketones, ur NEGATIVE NEGATIVE mg/dL   Protein, ur NEGATIVE NEGATIVE mg/dL   Nitrite NEGATIVE NEGATIVE   Leukocytes, UA SMALL (A) NEGATIVE   RBC / HPF 0-5 0 - 5 RBC/hpf   WBC, UA 6-30 0 - 5 WBC/hpf   Bacteria, UA RARE (A) NONE SEEN   Squamous Epithelial / LPF 0-5 (A) NONE SEEN  Hepatic function panel  Result Value Ref Range   Total Protein 6.6 6.5 - 8.1 g/dL   Albumin 3.9 3.5 - 5.0 g/dL   AST 20 15 - 41 U/L   ALT 20 14 - 54 U/L   Alkaline Phosphatase 87 38 - 126 U/L   Total Bilirubin 0.4 0.3 - 1.2 mg/dL   Bilirubin, Direct <4.6 (L) 0.1 - 0.5 mg/dL   Indirect Bilirubin NOT CALCULATED 0.3 - 0.9 mg/dL  Lipase, blood  Result Value Ref Range   Lipase 33 11 - 51 U/L  Troponin I  Result Value Ref Range   Troponin I <0.03 <0.03 ng/mL  CBG monitoring, ED  Result Value Ref Range   Glucose-Capillary 98 65 - 99 mg/dL    Laboratory interpretation all normal except possible UTI   EKG  EKG Interpretation  Date/Time:  Thursday April 25 2017 23:47:02 EDT Ventricular Rate:  89 PR Interval:    QRS Duration: 93 QT Interval:  375 QTC Calculation: 457 R Axis:   81 Text Interpretation:  Sinus rhythm Probable left atrial enlargement Baseline wander in lead(s) V3 V5 V6 No old tracing to compare Confirmed by Devoria Albe ([id number]) on  04/26/2017 2:18:02 AM      ED ECG REPORT   Date: 04/26/2017  Rate: 89  Rhythm: normal sinus rhythm  QRS Axis: normal  Intervals: normal  ST/T Wave abnormalities: normal  Conduction Disutrbances:none  Narrative Interpretation:    Old EKG Reviewed: none available  I have personally reviewed the EKG tracing and agree with the computerized printout as noted.   Radiology Ct Head Wo Contrast  Result Date: 04/26/2017 CLINICAL DATA:  RIGHT arm numbness for 4 hours, now with headache and dizziness. EXAM: CT HEAD WITHOUT CONTRAST TECHNIQUE: Contiguous axial images were obtained from the base of the skull through the vertex without intravenous contrast. COMPARISON:  None. FINDINGS: BRAIN: No intraparenchymal hemorrhage, mass effect nor midline shift. The ventricles and sulci are normal. No acute large vascular territory infarcts. No abnormal extra-axial fluid collections. Basal cisterns are patent. VASCULAR: Unremarkable. SKULL/SOFT TISSUES: No skull fracture. Small LEFT frontal calvarial exostosis. No significant soft tissue swelling. ORBITS/SINUSES: The included ocular globes and orbital contents are normal. Pneumatized petrous apices. The mastoid aircells and included paranasal sinuses are well-aerated. OTHER: None. IMPRESSION: Negative noncontrast CT HEAD. Electronically Signed   By: Awilda Metro M.D.   On: 04/26/2017 03:16    Procedures Procedures (including critical care time)  Medications Ordered in ED Medications  cephALEXin (KEFLEX) capsule 500 mg (500 mg Oral Given 04/26/17 0425)     Initial Impression / Assessment and Plan / ED Course  I have reviewed the triage vital signs and the nursing notes.  Pertinent labs & imaging results that were available during my care of the patient were reviewed by me and considered in my medical decision making (see chart for details).    DIAGNOSTIC STUDIES: Oxygen Saturation is 99% on RA, normal by my interpretation.    COORDINATION OF CARE: 2:19 AM Discussed treatment plan with pt at bedside and pt agreed to plan, which includes a head CT, And added laboratory testing, liver function tests and lipase for further evaluation of her epigastric abdominal pain. On review of  her prior testing shows she had a abdominal ultrasound done in 2014 that was normal. Unless her blood test show an abnormality tonight a repeat ultrasound does not appear to be indicated.  3:49 AM Patient was advised of her test results, which were normal. Patient advised of a urinary tract infection. She states she has had frequency without dysuria. Patient is ready for discharge. Patient states she's feeling improved.  Patient's stroke risk calculator shows her 10 year stroke probability is 2%. I offered to have her stay in the ED and get a MRI of her brain tonight however she wants to follow-up with her PCP.  Final Clinical Impressions(s) / ED Diagnoses   Final diagnoses:  Right upper extremity numbness  Dizziness  Epigastric abdominal pain  Acute cystitis without hematuria    New Prescriptions New Prescriptions   CEPHALEXIN (KEFLEX) 500 MG CAPSULE    Take 1 capsule (500 mg total) by mouth 3 (three) times daily.   Plan discharge  Laboratory interpretation all normal except   I personally performed the services described in this documentation, which was scribed in my presence. The recorded information has been reviewed and considered.  Devoria Albe, MD, Concha Pyo, MD 04/26/17 [phone]

## 2017-04-26 NOTE — Discharge Instructions (Signed)
Call your doctor's office this morning to let them know about your ED visit. They may want to do further testing to evaluate the numbness you have had in your right arm.  Return to the ED if it happens again and you haven't had any testing done yet. Take the antibiotics until gone. Recheck if you get a fever, vomiting, flank pain, worsening abdominal pain.

## 2017-04-26 NOTE — ED Triage Notes (Signed)
Patient reports she was out tonight and developed dizziness and right arm numbness. Also, states she is experiencing epigastric pain that radiates to her back. Pt reports the dizziness was more tense than when she normally experiences dizziness. Pt appears to no acute distress.

## 2017-06-06 ENCOUNTER — Other Ambulatory Visit: Payer: Self-pay | Admitting: Physician Assistant

## 2017-06-06 DIAGNOSIS — Z1231 Encounter for screening mammogram for malignant neoplasm of breast: Secondary | ICD-10-CM

## 2017-06-12 ENCOUNTER — Ambulatory Visit
Admission: RE | Admit: 2017-06-12 | Discharge: 2017-06-12 | Disposition: A | Discharge status: Home / Self Care | Payer: Managed Care, Other (non HMO) | Source: Ambulatory Visit | Attending: Physician Assistant | Admitting: Physician Assistant

## 2017-06-12 DIAGNOSIS — Z1231 Encounter for screening mammogram for malignant neoplasm of breast: Secondary | ICD-10-CM

## 2017-06-19 ENCOUNTER — Other Ambulatory Visit: Payer: Self-pay | Admitting: Physician Assistant

## 2017-06-19 DIAGNOSIS — N941 Unspecified dyspareunia: Secondary | ICD-10-CM

## 2017-06-19 DIAGNOSIS — N950 Postmenopausal bleeding: Secondary | ICD-10-CM

## 2017-06-24 ENCOUNTER — Ambulatory Visit
Admission: RE | Admit: 2017-06-24 | Discharge: 2017-06-24 | Disposition: A | Discharge status: Home / Self Care | Payer: Managed Care, Other (non HMO) | Source: Ambulatory Visit | Attending: Physician Assistant | Admitting: Physician Assistant

## 2017-06-24 DIAGNOSIS — N941 Unspecified dyspareunia: Secondary | ICD-10-CM

## 2017-06-24 DIAGNOSIS — N950 Postmenopausal bleeding: Secondary | ICD-10-CM

## 2017-07-07 ENCOUNTER — Emergency Department (HOSPITAL_COMMUNITY)
Admission: EM | Admit: 2017-07-07 | Discharge: 2017-07-08 | Discharge status: Against Medical Advice | Payer: Managed Care, Other (non HMO) | Attending: Emergency Medicine | Admitting: Emergency Medicine

## 2017-07-07 DIAGNOSIS — Z5321 Procedure and treatment not carried out due to patient leaving prior to being seen by health care provider: Secondary | ICD-10-CM | POA: Diagnosis not present

## 2017-07-07 DIAGNOSIS — R1030 Lower abdominal pain, unspecified: Secondary | ICD-10-CM | POA: Diagnosis present

## 2017-07-08 LAB — URINALYSIS, ROUTINE W REFLEX MICROSCOPIC
Bacteria, UA: NONE SEEN
Bilirubin Urine: NEGATIVE
Glucose, UA: NEGATIVE mg/dL
Ketones, ur: NEGATIVE mg/dL
Nitrite: NEGATIVE
Protein, ur: NEGATIVE mg/dL
Specific Gravity, Urine: >1.03 — ABNORMAL HIGH (ref 1.005–1.030)
pH: 6 (ref 5.0–8.0)

## 2017-07-08 NOTE — ED Notes (Signed)
Pt with dx of kidney stones c/o flank pain and n/v x1 onset tonight. Pt has taken tylenol and acetaminophen without relief.

## 2017-07-08 NOTE — ED Notes (Signed)
No answer when called for triage 

## 2018-02-07 ENCOUNTER — Other Ambulatory Visit: Payer: Self-pay | Admitting: Physician Assistant

## 2018-02-07 ENCOUNTER — Ambulatory Visit
Admission: RE | Admit: 2018-02-07 | Discharge: 2018-02-07 | Disposition: A | Discharge status: Home / Self Care | Payer: Managed Care, Other (non HMO) | Source: Ambulatory Visit | Attending: Physician Assistant | Admitting: Physician Assistant

## 2018-02-07 DIAGNOSIS — R52 Pain, unspecified: Secondary | ICD-10-CM

## 2018-05-19 IMAGING — CT CT HEAD W/O CM
3 of 4 series · 16 of 47 positions shown, 19 images · non-contrast
Comparison: None.

CLINICAL DATA: RIGHT arm numbness for 4 hours, now with headache
and dizziness.

EXAM:
CT HEAD WITHOUT CONTRAST
TECHNIQUE: Contiguous axial images were obtained from the base of the skull
through the vertex without intravenous contrast.

[Series 2: head w/o · axial · non-contrast · 0.45mm/px · z∈[+1718,+1843]mm · 10 of 31 slices shown, 13 images]
[im 3/31  brain]
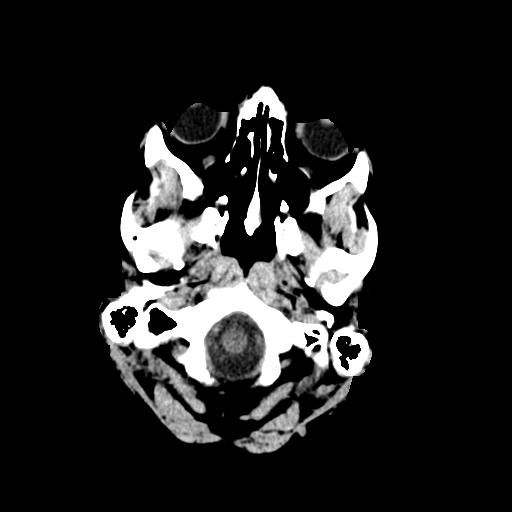
[im 3/31  bone]
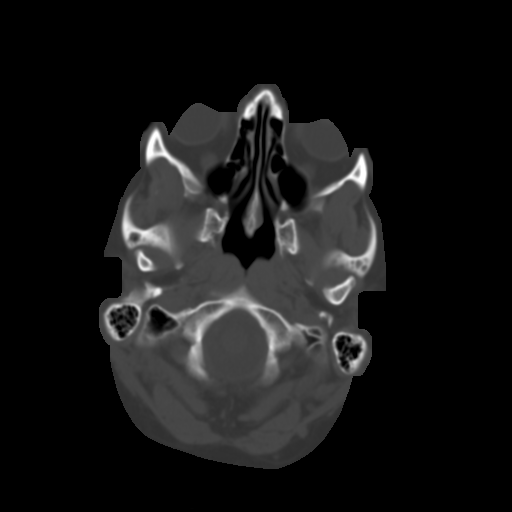
[im 5/31  brain]
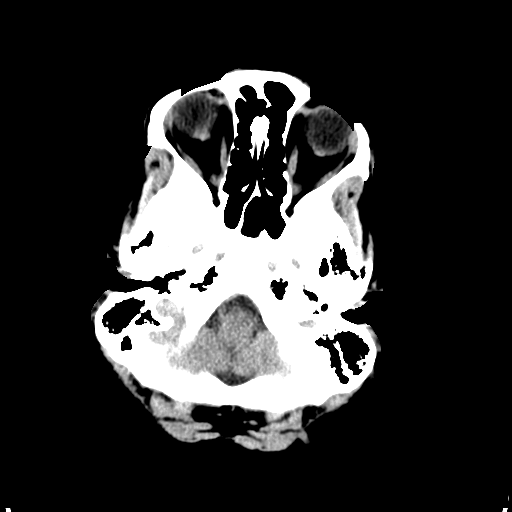
[im 9/31  brain]
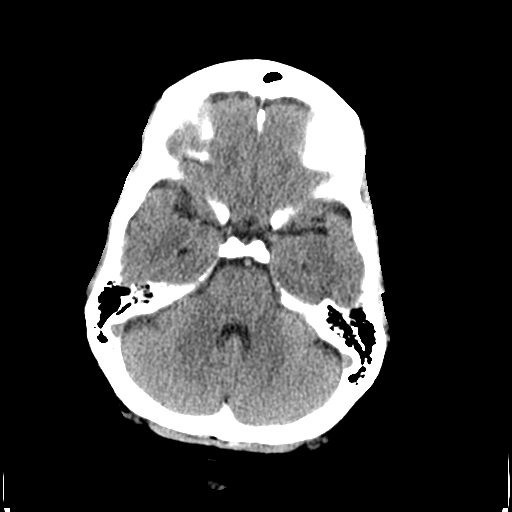
[im 11/31  brain]
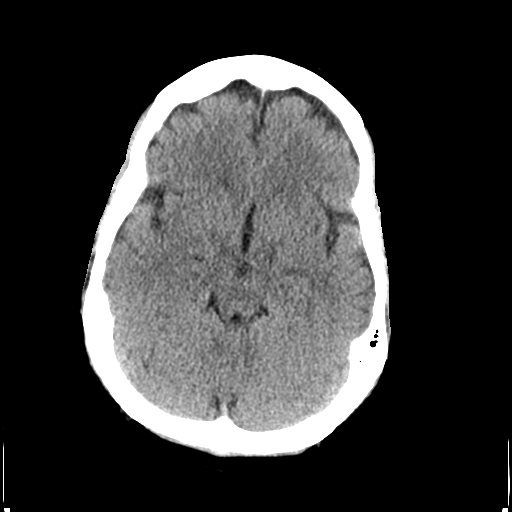
[im 13/31  brain]
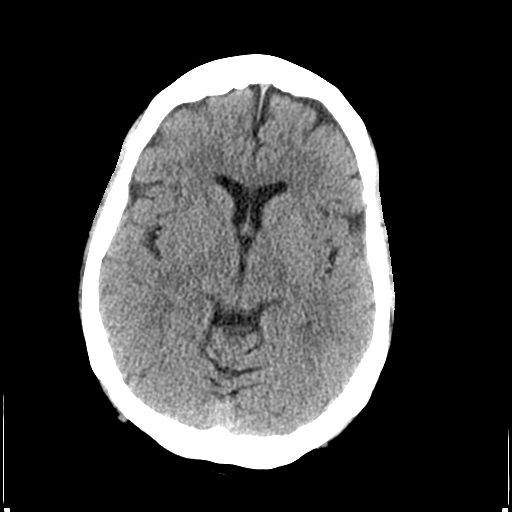
[im 13/31  bone]
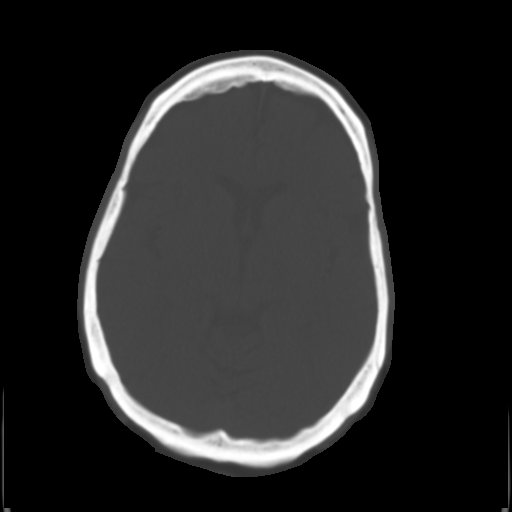
[im 18/31  brain]
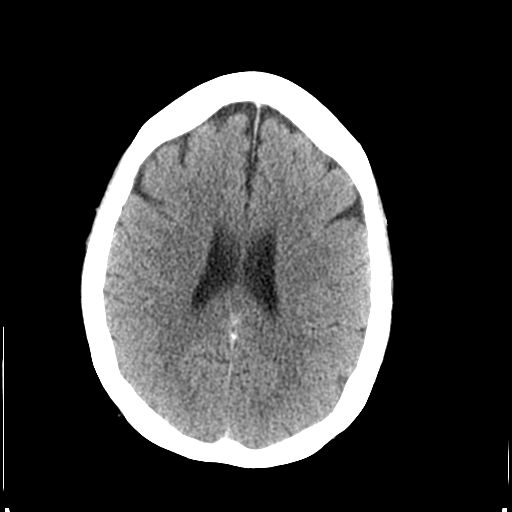
[im 20/31  brain]
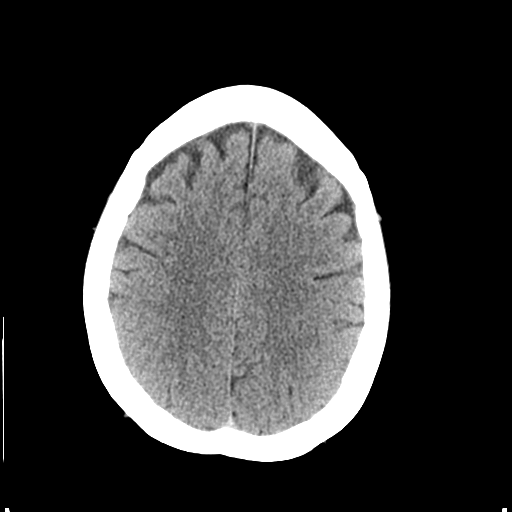
[im 22/31  brain]
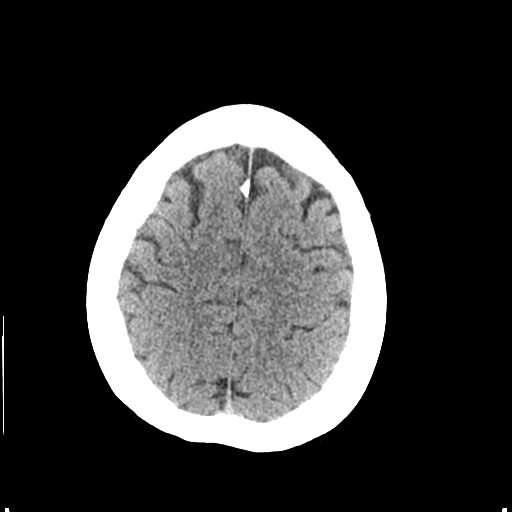
[im 26/31  brain]
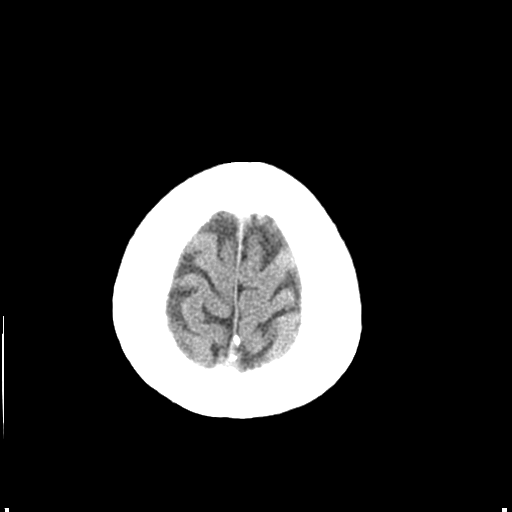
[im 26/31  bone]
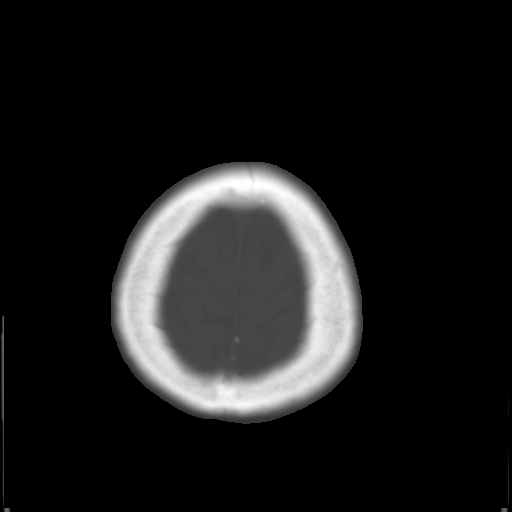
[im 28/31  brain]
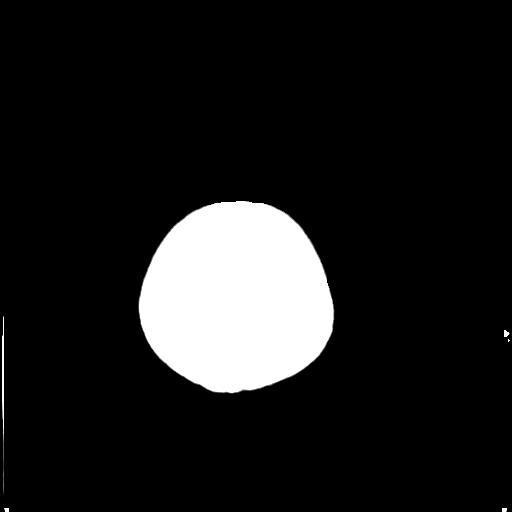

[Series 4: coronal · coronal · 0.29mm/px · 3 of 64 slices shown]
[im 22/64  brain]
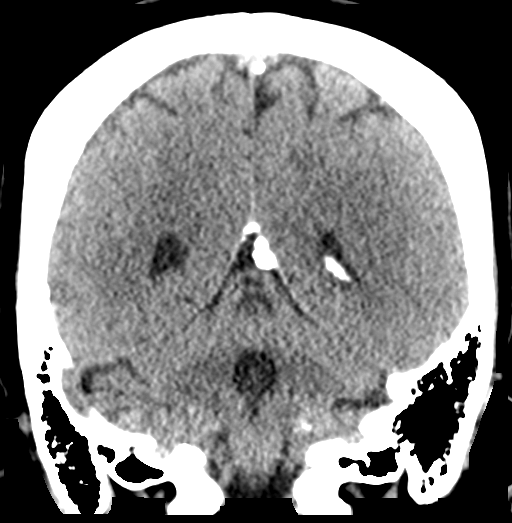
[im 29/64  brain]
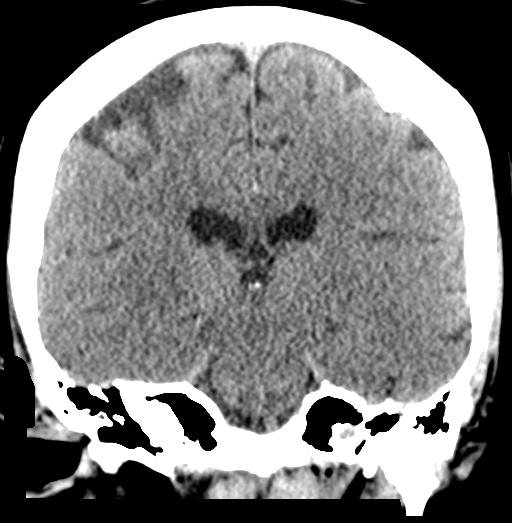
[im 36/64  brain]
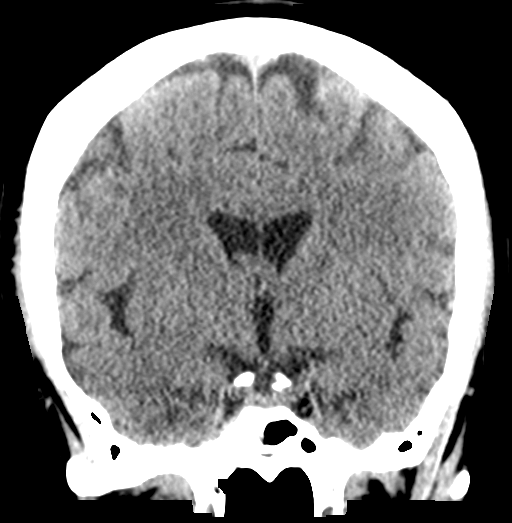

[Series 5: sagittal · sagittal · 0.29mm/px · 3 of 49 slices shown]
[im 17/49  brain]
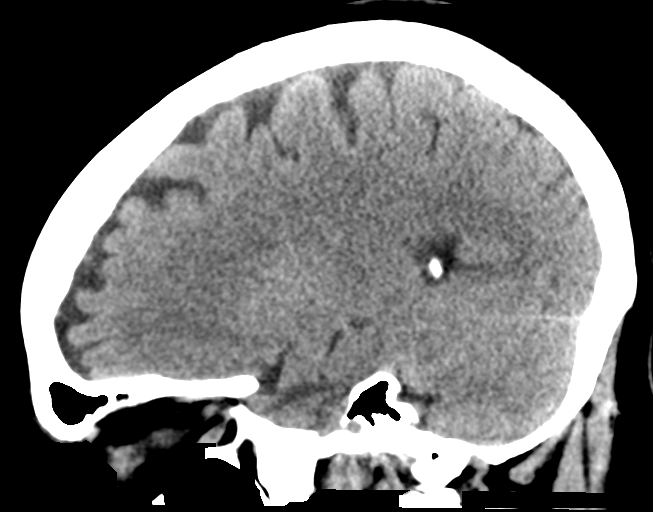
[im 25/49  brain]
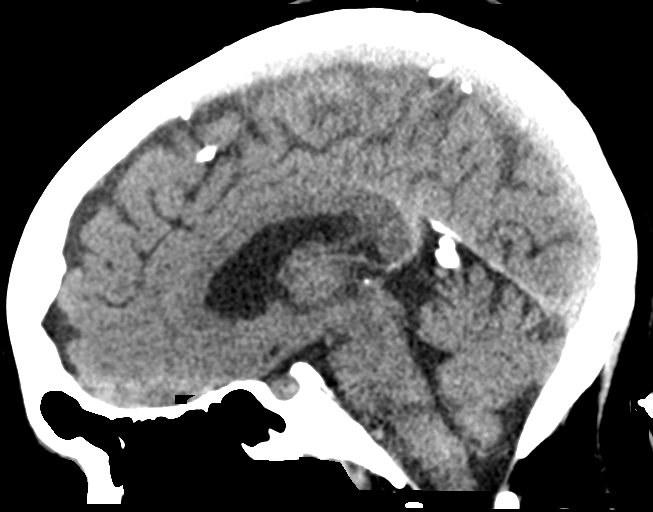
[im 33/49  brain]
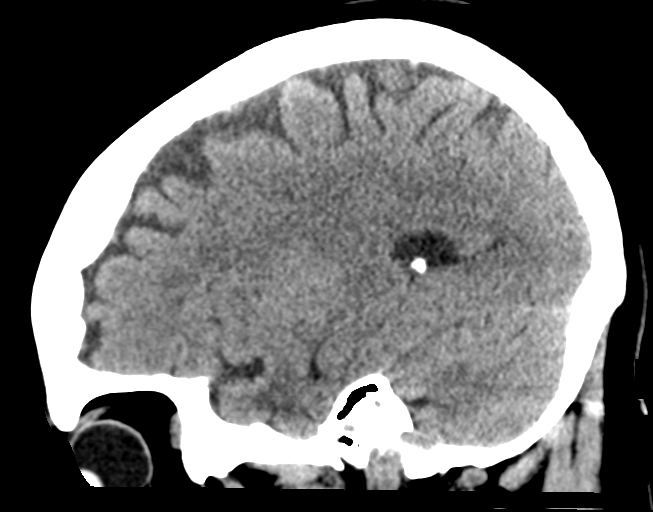

[16 of 47 positions shown; findings below may reference images not displayed]

FINDINGS: BRAIN: No intraparenchymal hemorrhage, mass effect nor midline
shift. The ventricles and sulci are normal. No acute large vascular
territory infarcts. No abnormal extra-axial fluid collections. Basal
cisterns are patent.

VASCULAR: Unremarkable.

SKULL/SOFT TISSUES: No skull fracture. Small LEFT frontal calvarial
exostosis. No significant soft tissue swelling.

ORBITS/SINUSES: The included ocular globes and orbital contents are
normal. Pneumatized petrous apices. The mastoid aircells and
included paranasal sinuses are well-aerated.

OTHER: None.
IMPRESSION: Negative noncontrast CT HEAD.

## 2019-09-19 ENCOUNTER — Emergency Department (HOSPITAL_COMMUNITY): Payer: Managed Care, Other (non HMO)

## 2019-09-19 ENCOUNTER — Inpatient Hospital Stay (HOSPITAL_COMMUNITY)
Admission: EM | Admit: 2019-09-19 | Discharge: 2019-09-23 | DRG: 854 | Disposition: A | Discharge status: Home / Self Care | Payer: Managed Care, Other (non HMO) | Attending: Internal Medicine | Admitting: Internal Medicine

## 2019-09-19 ENCOUNTER — Encounter (HOSPITAL_COMMUNITY)
Admission: EM | Disposition: A | Discharge status: Home / Self Care | Payer: Self-pay | Source: Home / Self Care | Attending: Internal Medicine

## 2019-09-19 ENCOUNTER — Inpatient Hospital Stay (HOSPITAL_COMMUNITY): Payer: Managed Care, Other (non HMO) | Admitting: Certified Registered"

## 2019-09-19 ENCOUNTER — Other Ambulatory Visit: Payer: Self-pay

## 2019-09-19 ENCOUNTER — Encounter (HOSPITAL_COMMUNITY): Payer: Self-pay | Admitting: Emergency Medicine

## 2019-09-19 ENCOUNTER — Inpatient Hospital Stay (HOSPITAL_COMMUNITY): Payer: Managed Care, Other (non HMO)

## 2019-09-19 DIAGNOSIS — N136 Pyonephrosis: Secondary | ICD-10-CM | POA: Diagnosis present

## 2019-09-19 DIAGNOSIS — Z86718 Personal history of other venous thrombosis and embolism: Secondary | ICD-10-CM

## 2019-09-19 DIAGNOSIS — Z79891 Long term (current) use of opiate analgesic: Secondary | ICD-10-CM

## 2019-09-19 DIAGNOSIS — N200 Calculus of kidney: Secondary | ICD-10-CM | POA: Diagnosis present

## 2019-09-19 DIAGNOSIS — Z20828 Contact with and (suspected) exposure to other viral communicable diseases: Secondary | ICD-10-CM | POA: Diagnosis present

## 2019-09-19 DIAGNOSIS — Z87442 Personal history of urinary calculi: Secondary | ICD-10-CM | POA: Diagnosis not present

## 2019-09-19 DIAGNOSIS — Z7982 Long term (current) use of aspirin: Secondary | ICD-10-CM | POA: Diagnosis not present

## 2019-09-19 DIAGNOSIS — K219 Gastro-esophageal reflux disease without esophagitis: Secondary | ICD-10-CM | POA: Diagnosis present

## 2019-09-19 DIAGNOSIS — B957 Other staphylococcus as the cause of diseases classified elsewhere: Secondary | ICD-10-CM | POA: Diagnosis present

## 2019-09-19 DIAGNOSIS — Z79899 Other long term (current) drug therapy: Secondary | ICD-10-CM

## 2019-09-19 DIAGNOSIS — R7881 Bacteremia: Secondary | ICD-10-CM | POA: Diagnosis not present

## 2019-09-19 DIAGNOSIS — A419 Sepsis, unspecified organism: Principal | ICD-10-CM | POA: Diagnosis present

## 2019-09-19 DIAGNOSIS — N12 Tubulo-interstitial nephritis, not specified as acute or chronic: Secondary | ICD-10-CM | POA: Diagnosis not present

## 2019-09-19 DIAGNOSIS — F419 Anxiety disorder, unspecified: Secondary | ICD-10-CM | POA: Diagnosis present

## 2019-09-19 DIAGNOSIS — F329 Major depressive disorder, single episode, unspecified: Secondary | ICD-10-CM | POA: Diagnosis present

## 2019-09-19 DIAGNOSIS — B952 Enterococcus as the cause of diseases classified elsewhere: Secondary | ICD-10-CM | POA: Diagnosis present

## 2019-09-19 DIAGNOSIS — N390 Urinary tract infection, site not specified: Secondary | ICD-10-CM | POA: Diagnosis not present

## 2019-09-19 DIAGNOSIS — Z87891 Personal history of nicotine dependence: Secondary | ICD-10-CM

## 2019-09-19 DIAGNOSIS — Z818 Family history of other mental and behavioral disorders: Secondary | ICD-10-CM | POA: Diagnosis not present

## 2019-09-19 DIAGNOSIS — D649 Anemia, unspecified: Secondary | ICD-10-CM | POA: Diagnosis present

## 2019-09-19 DIAGNOSIS — Z820 Family history of epilepsy and other diseases of the nervous system: Secondary | ICD-10-CM

## 2019-09-19 HISTORY — PX: CYSTOSCOPY W/ URETERAL STENT PLACEMENT: SHX1429

## 2019-09-19 LAB — CBC
HCT: 45.1 % (ref 36.0–46.0)
Hemoglobin: 15.5 g/dL — ABNORMAL HIGH (ref 12.0–15.0)
MCH: 32.2 pg (ref 26.0–34.0)
MCHC: 34.4 g/dL (ref 30.0–36.0)
MCV: 93.6 fL (ref 80.0–100.0)
Platelets: 248 10*3/uL (ref 150–400)
RBC: 4.82 MIL/uL (ref 3.87–5.11)
RDW: 11.9 % (ref 11.5–15.5)
WBC: 14.7 10*3/uL — ABNORMAL HIGH (ref 4.0–10.5)
nRBC: 0 % (ref 0.0–0.2)

## 2019-09-19 LAB — HEPATIC FUNCTION PANEL
ALT: 35 U/L (ref 0–44)
AST: 40 U/L (ref 15–41)
Albumin: 3.4 g/dL — ABNORMAL LOW (ref 3.5–5.0)
Alkaline Phosphatase: 85 U/L (ref 38–126)
Bilirubin, Direct: 0.2 mg/dL (ref 0.0–0.2)
Indirect Bilirubin: 0.4 mg/dL (ref 0.3–0.9)
Total Bilirubin: 0.6 mg/dL (ref 0.3–1.2)
Total Protein: 6.1 g/dL — ABNORMAL LOW (ref 6.5–8.1)

## 2019-09-19 LAB — BASIC METABOLIC PANEL
Anion gap: 10 (ref 5–15)
BUN: 13 mg/dL (ref 6–20)
CO2: 22 mmol/L (ref 22–32)
Calcium: 10.1 mg/dL (ref 8.9–10.3)
Chloride: 104 mmol/L (ref 98–111)
Creatinine, Ser: 1.01 mg/dL — ABNORMAL HIGH (ref 0.44–1.00)
GFR calc Af Amer: >60 mL/min (ref >60)
GFR calc non Af Amer: >60 mL/min (ref >60)
Glucose, Bld: 129 mg/dL — ABNORMAL HIGH (ref 70–99)
Potassium: 3.9 mmol/L (ref 3.5–5.1)
Sodium: 136 mmol/L (ref 135–145)

## 2019-09-19 LAB — SARS CORONAVIRUS 2 BY RT PCR (HOSPITAL ORDER, PERFORMED IN ~~LOC~~ HOSPITAL LAB): SARS Coronavirus 2: NEGATIVE

## 2019-09-19 LAB — URINALYSIS, ROUTINE W REFLEX MICROSCOPIC
Bacteria, UA: NONE SEEN
Bilirubin Urine: NEGATIVE
Glucose, UA: NEGATIVE mg/dL
Ketones, ur: NEGATIVE mg/dL
Nitrite: NEGATIVE
Protein, ur: 30 mg/dL — AB
Specific Gravity, Urine: 1.016 (ref 1.005–1.030)
WBC, UA: >50 WBC/hpf — ABNORMAL HIGH (ref 0–5)
pH: 6 (ref 5.0–8.0)

## 2019-09-19 LAB — LACTIC ACID, PLASMA
Lactic Acid, Venous: 1.4 mmol/L (ref 0.5–1.9)
Lactic Acid, Venous: 0.9 mmol/L (ref 0.5–1.9)

## 2019-09-19 LAB — I-STAT BETA HCG BLOOD, ED (MC, WL, AP ONLY): I-stat hCG, quantitative: <5 m[IU]/mL (ref <5)

## 2019-09-19 SURGERY — CYSTOSCOPY, WITH RETROGRADE PYELOGRAM AND URETERAL STENT INSERTION
Anesthesia: General | Laterality: Right

## 2019-09-19 MED ORDER — LIDOCAINE 2% (20 MG/ML) 5 ML SYRINGE
INTRAMUSCULAR | Status: AC
  Filled 2019-09-19: qty 5

## 2019-09-19 MED ORDER — SODIUM CHLORIDE 0.9 % IV SOLN
2.0000 g | Freq: Once | INTRAVENOUS | Status: AC
  Administered 2019-09-19: 20:00:00 2 g via INTRAVENOUS
  Filled 2019-09-19: qty 20

## 2019-09-19 MED ORDER — ACETAMINOPHEN 325 MG PO TABS
650.0000 mg | ORAL_TABLET | Freq: Once | ORAL | Status: AC
  Administered 2019-09-19: 650 mg via ORAL
  Filled 2019-09-19: qty 2

## 2019-09-19 MED ORDER — LIDOCAINE 2% (20 MG/ML) 5 ML SYRINGE
INTRAMUSCULAR | Status: DC | PRN
  Administered 2019-09-19: 100 mg via INTRAVENOUS

## 2019-09-19 MED ORDER — SODIUM CHLORIDE 0.9 % IV BOLUS
1000.0000 mL | Freq: Once | INTRAVENOUS | Status: AC
  Administered 2019-09-19: 18:00:00 1000 mL via INTRAVENOUS

## 2019-09-19 MED ORDER — PROMETHAZINE HCL 25 MG/ML IJ SOLN: 6.2500 mg | INTRAMUSCULAR | Status: DC | PRN

## 2019-09-19 MED ORDER — DIVALPROEX SODIUM ER 500 MG PO TB24
500.0000 mg | ORAL_TABLET | Freq: Every day | ORAL | Status: DC
  Administered 2019-09-21: 500 mg via ORAL
  Filled 2019-09-19 (×2): qty 1

## 2019-09-19 MED ORDER — FENTANYL CITRATE (PF) 100 MCG/2ML IJ SOLN
INTRAMUSCULAR | Status: DC | PRN
  Administered 2019-09-19: 25 ug via INTRAVENOUS
  Administered 2019-09-19: 50 ug via INTRAVENOUS
  Administered 2019-09-19: 25 ug via INTRAVENOUS

## 2019-09-19 MED ORDER — ASPIRIN 325 MG PO TABS
325.0000 mg | ORAL_TABLET | Freq: Every day | ORAL | Status: DC
  Administered 2019-09-20 – 2019-09-23 (×4): 325 mg via ORAL
  Filled 2019-09-19 (×4): qty 1

## 2019-09-19 MED ORDER — VITAMIN C 500 MG PO TABS
250.0000 mg | ORAL_TABLET | Freq: Every day | ORAL | Status: DC
  Administered 2019-09-20 – 2019-09-23 (×4): 250 mg via ORAL
  Filled 2019-09-19 (×4): qty 1

## 2019-09-19 MED ORDER — ENOXAPARIN SODIUM 40 MG/0.4ML ~~LOC~~ SOLN
40.0000 mg | SUBCUTANEOUS | Status: DC
  Administered 2019-09-20 – 2019-09-21 (×2): 40 mg via SUBCUTANEOUS
  Filled 2019-09-19 (×2): qty 0.4

## 2019-09-19 MED ORDER — HYDROMORPHONE HCL 1 MG/ML IJ SOLN: 0.2500 mg | INTRAMUSCULAR | Status: DC | PRN

## 2019-09-19 MED ORDER — ONDANSETRON HCL 4 MG/2ML IJ SOLN
INTRAMUSCULAR | Status: DC | PRN
  Administered 2019-09-19: 4 mg via INTRAVENOUS

## 2019-09-19 MED ORDER — PROPOFOL 10 MG/ML IV BOLUS
INTRAVENOUS | Status: DC | PRN
  Administered 2019-09-19: 160 mg via INTRAVENOUS

## 2019-09-19 MED ORDER — HYDROCODONE-ACETAMINOPHEN 5-325 MG PO TABS
1.0000 | ORAL_TABLET | ORAL | Status: DC | PRN
  Administered 2019-09-19 – 2019-09-20 (×2): 1 via ORAL
  Administered 2019-09-20 – 2019-09-21 (×2): 2 via ORAL
  Filled 2019-09-19: qty 1
  Filled 2019-09-19 (×2): qty 2
  Filled 2019-09-19: qty 1

## 2019-09-19 MED ORDER — IOHEXOL 300 MG/ML  SOLN
INTRAMUSCULAR | Status: DC | PRN
  Administered 2019-09-19: 50 mL via URETHRAL

## 2019-09-19 MED ORDER — ONDANSETRON HCL 4 MG PO TABS: 4.0000 mg | ORAL_TABLET | Freq: Four times a day (QID) | ORAL | Status: DC | PRN

## 2019-09-19 MED ORDER — ACETAMINOPHEN 650 MG RE SUPP: 650.0000 mg | Freq: Four times a day (QID) | RECTAL | Status: DC | PRN

## 2019-09-19 MED ORDER — ONDANSETRON HCL 4 MG/2ML IJ SOLN: 4.0000 mg | Freq: Four times a day (QID) | INTRAMUSCULAR | Status: DC | PRN

## 2019-09-19 MED ORDER — MIDAZOLAM HCL 5 MG/5ML IJ SOLN
INTRAMUSCULAR | Status: DC | PRN
  Administered 2019-09-19: 2 mg via INTRAVENOUS

## 2019-09-19 MED ORDER — DEXAMETHASONE SODIUM PHOSPHATE 10 MG/ML IJ SOLN
INTRAMUSCULAR | Status: DC | PRN
  Administered 2019-09-19: 10 mg via INTRAVENOUS

## 2019-09-19 MED ORDER — ONDANSETRON HCL 4 MG/2ML IJ SOLN
4.0000 mg | Freq: Once | INTRAMUSCULAR | Status: AC
  Administered 2019-09-19: 18:00:00 4 mg via INTRAVENOUS
  Filled 2019-09-19: qty 2

## 2019-09-19 MED ORDER — LACTATED RINGERS IV SOLN
INTRAVENOUS | Status: DC | PRN
  Administered 2019-09-19: 21:00:00 via INTRAVENOUS

## 2019-09-19 MED ORDER — STERILE WATER FOR IRRIGATION IR SOLN
Status: DC | PRN
  Administered 2019-09-19: 3000 mL

## 2019-09-19 MED ORDER — SODIUM CHLORIDE 0.9 % IV SOLN
INTRAVENOUS | Status: DC
  Administered 2019-09-19 – 2019-09-22 (×8): via INTRAVENOUS

## 2019-09-19 MED ORDER — MORPHINE SULFATE (PF) 4 MG/ML IV SOLN
4.0000 mg | Freq: Once | INTRAVENOUS | Status: AC
  Administered 2019-09-19: 4 mg via INTRAVENOUS
  Filled 2019-09-19: qty 1

## 2019-09-19 MED ORDER — CLONAZEPAM 0.125 MG PO TBDP
0.2500 mg | ORAL_TABLET | Freq: Every evening | ORAL | Status: DC | PRN
  Administered 2019-09-19 – 2019-09-20 (×2): 0.25 mg via ORAL
  Filled 2019-09-19 (×2): qty 2

## 2019-09-19 MED ORDER — HYDROCODONE-ACETAMINOPHEN 7.5-325 MG PO TABS: 1.0000 | ORAL_TABLET | Freq: Once | ORAL | Status: DC | PRN

## 2019-09-19 MED ORDER — DOCUSATE SODIUM 100 MG PO CAPS
100.0000 mg | ORAL_CAPSULE | Freq: Two times a day (BID) | ORAL | Status: DC
  Administered 2019-09-19 – 2019-09-23 (×8): 100 mg via ORAL
  Filled 2019-09-19 (×7): qty 1

## 2019-09-19 MED ORDER — MEPERIDINE HCL 50 MG/ML IJ SOLN: 6.2500 mg | INTRAMUSCULAR | Status: DC | PRN

## 2019-09-19 MED ORDER — PROPOFOL 10 MG/ML IV BOLUS
INTRAVENOUS | Status: AC
  Filled 2019-09-19: qty 20

## 2019-09-19 MED ORDER — SODIUM CHLORIDE 0.9 % IV SOLN
1.0000 g | INTRAVENOUS | Status: DC
  Administered 2019-09-20: 17:00:00 1 g via INTRAVENOUS
  Filled 2019-09-19: qty 10

## 2019-09-19 MED ORDER — DOCUSATE SODIUM 100 MG PO CAPS
100.0000 mg | ORAL_CAPSULE | Freq: Every day | ORAL | Status: DC | PRN
  Filled 2019-09-19: qty 1

## 2019-09-19 MED ORDER — ACETAMINOPHEN 325 MG PO TABS
650.0000 mg | ORAL_TABLET | Freq: Four times a day (QID) | ORAL | Status: DC | PRN
  Administered 2019-09-20 – 2019-09-22 (×4): 650 mg via ORAL
  Filled 2019-09-19 (×5): qty 2

## 2019-09-19 MED ORDER — FENTANYL CITRATE (PF) 100 MCG/2ML IJ SOLN
INTRAMUSCULAR | Status: AC
  Filled 2019-09-19: qty 2

## 2019-09-19 MED ORDER — LAMOTRIGINE 25 MG PO TABS
150.0000 mg | ORAL_TABLET | Freq: Every day | ORAL | Status: DC
  Administered 2019-09-20: 150 mg via ORAL
  Filled 2019-09-19 (×2): qty 2

## 2019-09-19 MED ORDER — ADULT MULTIVITAMIN W/MINERALS CH
1.0000 | ORAL_TABLET | Freq: Every day | ORAL | Status: DC
  Administered 2019-09-20 – 2019-09-23 (×4): 1 via ORAL
  Filled 2019-09-19 (×4): qty 1

## 2019-09-19 MED ORDER — DEXAMETHASONE SODIUM PHOSPHATE 10 MG/ML IJ SOLN
INTRAMUSCULAR | Status: AC
  Filled 2019-09-19: qty 1

## 2019-09-19 MED ORDER — MIDAZOLAM HCL 2 MG/2ML IJ SOLN
INTRAMUSCULAR | Status: AC
  Filled 2019-09-19: qty 2

## 2019-09-19 MED ORDER — VITAMIN B-12 100 MCG PO TABS
100.0000 ug | ORAL_TABLET | Freq: Every day | ORAL | Status: DC
  Administered 2019-09-20 – 2019-09-23 (×4): 100 ug via ORAL
  Filled 2019-09-19 (×4): qty 1

## 2019-09-19 MED ORDER — ACETAMINOPHEN 10 MG/ML IV SOLN
1000.0000 mg | Freq: Once | INTRAVENOUS | Status: DC | PRN
  Administered 2019-09-19: 21:00:00 1000 mg via INTRAVENOUS

## 2019-09-19 MED ORDER — BUPROPION HCL ER (XL) 300 MG PO TB24
300.0000 mg | ORAL_TABLET | Freq: Every day | ORAL | Status: DC
  Administered 2019-09-20 – 2019-09-23 (×4): 300 mg via ORAL
  Filled 2019-09-19 (×4): qty 1

## 2019-09-19 MED ORDER — TAMSULOSIN HCL 0.4 MG PO CAPS
0.4000 mg | ORAL_CAPSULE | Freq: Every day | ORAL | Status: DC
  Administered 2019-09-20 – 2019-09-22 (×3): 0.4 mg via ORAL
  Filled 2019-09-19 (×3): qty 1

## 2019-09-19 MED ORDER — SODIUM CHLORIDE 0.9 % IV BOLUS
1000.0000 mL | Freq: Once | INTRAVENOUS | Status: AC
  Administered 2019-09-19: 1000 mL via INTRAVENOUS

## 2019-09-19 MED ORDER — ONDANSETRON HCL 4 MG/2ML IJ SOLN
INTRAMUSCULAR | Status: AC
  Filled 2019-09-19: qty 2

## 2019-09-19 MED ORDER — ACETAMINOPHEN 10 MG/ML IV SOLN
INTRAVENOUS | Status: AC
  Filled 2019-09-19: qty 100

## 2019-09-19 MED ORDER — MORPHINE SULFATE (PF) 4 MG/ML IV SOLN
4.0000 mg | Freq: Once | INTRAVENOUS | Status: AC
  Administered 2019-09-19: 18:00:00 4 mg via INTRAVENOUS
  Filled 2019-09-19: qty 1

## 2019-09-19 MED ORDER — ARIPIPRAZOLE 5 MG PO TABS
5.0000 mg | ORAL_TABLET | Freq: Every day | ORAL | Status: DC
  Administered 2019-09-20: 5 mg via ORAL
  Filled 2019-09-19 (×2): qty 1

## 2019-09-19 SURGICAL SUPPLY — 14 items
BAG URO CATCHER STRL LF (MISCELLANEOUS) ×3 IMPLANT
CATH INTERMIT  6FR 70CM (CATHETERS) ×3 IMPLANT
CLOTH BEACON ORANGE TIMEOUT ST (SAFETY) ×3 IMPLANT
GLOVE BIO SURGEON STRL SZ7.5 (GLOVE) ×3 IMPLANT
GOWN STRL REUS W/TWL LRG LVL3 (GOWN DISPOSABLE) ×6 IMPLANT
GOWN STRL REUS W/TWL XL LVL3 (GOWN DISPOSABLE) ×3 IMPLANT
GUIDEWIRE STR DUAL SENSOR (WIRE) ×3 IMPLANT
KIT TURNOVER KIT A (KITS) IMPLANT
MANIFOLD NEPTUNE II (INSTRUMENTS) ×3 IMPLANT
PACK CYSTO (CUSTOM PROCEDURE TRAY) ×3 IMPLANT
STENT URET 6FRX24 CONTOUR (STENTS) ×2 IMPLANT
TUBING CONNECTING 10 (TUBING) ×2 IMPLANT
TUBING CONNECTING 10' (TUBING) ×1
TUBING UROLOGY SET (TUBING) IMPLANT

## 2019-09-19 NOTE — Consult Note (Signed)
Urology Consult Note   Requesting Attending Physician:  Charlynne Pander, MD Service Providing Consult: Urology   Reason for Consult:  Right obstructing infected stone  HPI: Kristin [name] is seen in consultation for reasons noted above at the request of Charlynne Pander, MD for evaluation of obstructing ureteral calculi in the setting of infection.  This is a 53 y.o. female with GERD, MDD, anxiety, nephrolithasis who presents with 7 days of right flank pain progressing to fever today. Fever of 102.9 at home. Mild nausea, no emesis. Mild dysuria, +frequency, +urgency, -incomplete emptying, -hemautira.   Was seen yesterday elsewhere and was not started on antibiotics; she reports only found blood on UA.   CT with 44mm right UPJ stone with proximal hydronephrosis and perinephric stranding.   Has history of stones including two prior surgeries at [facility]. Has done 24hr urine studies.   No obvious lithogenic medications, no prior GI surgeries, no gout.    Past Medical History: Past Medical History:  Diagnosis Date   Anxiety    Balance problem    Bilateral posterior subcapsular polar cataract    Depression, major    Dizziness    DVT (deep venous thrombosis) (HCC)    GERD (gastroesophageal reflux disease)    Neuropathy    Renal calculi     Past Surgical History:  Past Surgical History:  Procedure Laterality Date   CESAREAN SECTION     LITHOTRIPSY      Medication: Current Facility-Administered Medications  Medication Dose Route Frequency Provider Last Rate Last Dose   cefTRIAXone (ROCEPHIN) 2 g in sodium chloride 0.9 % 100 mL IVPB  2 g Intravenous Once Charlynne Pander, MD       morphine 4 MG/ML injection 4 mg  4 mg Intravenous Once Charlynne Pander, MD       Current Outpatient Medications  Medication Sig Dispense Refill   ARIPiprazole (ABILIFY) 5 MG tablet Take 5 mg by mouth daily.     aspirin 325 MG tablet Take 325 mg by mouth daily.      buPROPion (WELLBUTRIN XL) 300 MG 24 hr tablet Take 1 tablet by mouth daily.     Calcium-Magnesium-Vitamin D (CALCIUM 500 PO) Take 1 capsule by mouth daily.     cephALEXin (KEFLEX) 500 MG capsule Take 1 capsule (500 mg total) by mouth 3 (three) times daily. 21 capsule 0   clonazePAM (KLONOPIN) 0.5 MG tablet Take 0.5-0.75 mg by mouth 2 (two) times daily as needed for anxiety.      divalproex (DEPAKOTE ER) 500 MG 24 hr tablet Take 500 mg by mouth daily.      docusate sodium (COLACE) 100 MG capsule Take 100 mg by mouth daily as needed for mild constipation or moderate constipation.      lamoTRIgine (LAMICTAL) 100 MG tablet Take 150 mg by mouth daily.      Multiple Vitamin (MULTIVITAMIN) tablet Take 1 tablet by mouth daily.     verapamil (CALAN-SR) 120 MG CR tablet Take 1 tablet (120 mg total) by mouth at bedtime. (Patient not taking: Reported on 08/08/2015) 30 tablet 3   vitamin B-12 (CYANOCOBALAMIN) 100 MCG tablet Take 100 mcg by mouth daily.     vitamin C (ASCORBIC ACID) 250 MG tablet Take 250 mg by mouth daily.      Allergies: No Known Allergies  Social History: Social History   Tobacco Use   Smoking status: Former Smoker   Smokeless tobacco: Never Used  Substance Use Topics  Alcohol use: Yes    Comment: Two drinks about every 2-4 weeks    Drug use: No    Family History Family History  Problem Relation Age of Onset   Depression Sister    Cancer Sister        thyroid   Multiple sclerosis Cousin     Review of Systems 10 systems were reviewed and are negative except as noted specifically in the HPI.  Objective   Vital signs in last 24 hours: BP 127/85    Pulse (!) 121    Temp (!) 100.7 F (38.2 C) (Oral)    Resp 18    SpO2 95%   Physical Exam General: NAD, A&O, resting, appropriate HEENT: [place]/AT, EOMI, MMM Pulmonary: Normal work of breathing Cardiovascular: HDS, adequate peripheral perfusion Abdomen: Soft, NTTP, nondistended. GU: RIGHT flank  pain/tenderness Extremities: warm and well perfused Neuro: Appropriate, no focal neurological deficits  Most Recent Labs: Lab Results  Component Value Date   WBC 14.7 (H) 09/19/2019   HGB 15.5 (H) 09/19/2019   HCT 45.1 09/19/2019   PLT 248 09/19/2019    Lab Results  Component Value Date   NA 136 09/19/2019   K 3.9 09/19/2019   CL 104 09/19/2019   CO2 22 09/19/2019   BUN 13 09/19/2019   CREATININE 1.01 (H) 09/19/2019   CALCIUM 10.1 09/19/2019    No results found for: INR, APTT   Urine Culture: @[id number] (laburin,org,[id number])@   IMAGING: Dg Chest 2 View  Result Date: 09/19/2019 CLINICAL DATA:  Flank pain EXAM: CHEST - 2 VIEW COMPARISON:  None. FINDINGS: Lungs are clear.  No pleural effusion or pneumothorax. The heart is normal in size. Visualized osseous structures are within normal limits. IMPRESSION: Normal chest radiographs. Electronically Signed   By: Charline BillsSriyesh  Krishnan M.D.   On: 09/19/2019 18:03   Ct Renal Stone Study  Result Date: 09/19/2019 CLINICAL DATA:  Right flank pain and right groin pain. Hx of kidney stones. EXAM: CT ABDOMEN AND PELVIS WITHOUT CONTRAST TECHNIQUE: Multidetector CT imaging of the abdomen and pelvis was performed following the standard protocol without IV contrast. COMPARISON:  None. FINDINGS: Lower chest: No acute abnormality. Hepatobiliary: No focal liver abnormality is seen. No gallstones, gallbladder wall thickening, or biliary dilatation. Pancreas: Unremarkable. Spleen: Normal in size without focal abnormality. Adrenals/Urinary Tract: Adrenal glands are unremarkable. Multiple small left renal calculi no hydronephrosis. The right kidney is mildly enlarged with perinephric stranding. There is moderate right hydronephrosis secondary to a 1.0 cm calculus at the right UPJ. No additional calculi in the right kidney. Urinary bladder is unremarkable. Stomach/Bowel: Stomach is within normal limits. Appendix appears normal. No evidence of bowel wall  thickening, distention, or inflammatory changes. Scattered colonic diverticula without evidence of diverticulitis. Vascular/Lymphatic: No significant vascular findings are present. No enlarged abdominal or pelvic lymph nodes. Reproductive: Uterus and bilateral adnexa are unremarkable. Other: No abdominal wall hernia or abnormality. No abdominopelvic ascites. Musculoskeletal: No acute or significant osseous findings. IMPRESSION: 1. Obstructing 1.0 cm calculus at the right UPJ with associated moderate right hydronephrosis and perinephric stranding. 2. Multiple nonobstructing left renal calculi. Electronically Signed   By: Emmaline KluverNancy  Ballantyne M.D.   On: 09/19/2019 17:47   ------  Assessment:  53 y.o. female with GERD, MDD, anxiety, nephrolithasis who presents with 12mm right UPJ stone with hydronephrosis and infection.   Febrile (102.9 at home). Tachycardic. Normotensive fortunately. WBC 14.7. Lactate 1.4. Creatinine 1.0 from 0.8. UA with pyuria. CT with 12mm right UPJ stone with proximal hydronephrosis and  perinephric stranding.   Will plan for right ureteral stent for urinary decompression. Discussed risks and benefits, she agrees. Discussed possible inability to stent in which case PCN would be recommended, she agrees.   Recommendations: - Keep NPO - COVID test STAT - Agree with 2g CTX - To OR tonight for right ureteral stent - Appreciate hospitalist assistance   Thank you for this consult. Please contact the urology consult pager with any further questions/concerns.

## 2019-09-19 NOTE — Op Note (Signed)
Operative Note  Preoperative diagnosis:  1.  Right ureteral calculus with urinary tract infection  Post operative diagnosis: 1.  Right ureteral calculus with urinary tract infection  Procedure(s): 1.  Cystoscopy with right retrograde pyelogram and right ureteral stent placement  Surgeon: Link Snuffer, MD  Assistants: Sharlot Gowda  Anesthesia: General  Complications: None immediate  EBL: Minimal  Specimens: 1.  Urine culture  Drains/Catheters: 1.  6 X 24 double-J ureteral stent  Intraoperative findings: 1.  Normal urethra and bladder 2.  Right retrograde pyelogram revealed a filling defect at the level of the stone with upstream hydroureteronephrosis 3.  Cloudy Efflux from the ureteral orifice after decompression  Indication: 53 year old female presented with right-sided flank pain, fever, tachycardia and leukocytosis.  CT scan showed a 1 cm right ureteropelvic junction calculus.  She presents for urgent ureteral stent placement.  Description of procedure:  The patient was identified and consent was obtained.  The patient was taken to the operating room and placed in the supine position.  The patient was placed under general anesthesia.  Perioperative antibiotics were administered.  The patient was placed in dorsal lithotomy.  Patient was prepped and draped in a standard sterile fashion and a timeout was performed.  A 21 French rigid cystoscope was advanced into the urethra and into the bladder.  The right distal most portion of the ureter was cannulated with an open-ended ureteral catheter.  A sensor wire was advanced up to the kidney under fluoroscopic guidance.  The open-ended ureteral catheter was advanced over the wire and into the renal pelvis.  The wire was withdrawn and a urine culture was obtained.  A retrograde pyelogram was performed through the ureteral catheter.  The wire was readvanced into the kidney and the ureteral catheter was removed.  A 6 X 24 double-J ureteral  stent was advanced up to the kidney under fluoroscopic guidance.  The wire was withdrawn and fluoroscopy confirmed good proximal placement and direct visualization confirmed a good coil within the bladder.  The bladder was drained and the scope withdrawn.  This concluded the operation.  Patient tolerated procedure well and was stable postoperatively.  Plan: Continue broad-spectrum antibiotics until cultures return.  She will need to have definitive management of her stone in 1 to 2 weeks or so.

## 2019-09-19 NOTE — ED Notes (Signed)
Unsuccessful attempt at second set of blood culture collection.

## 2019-09-19 NOTE — ED Provider Notes (Signed)
[facility]-EMERGENCY DEPT Provider Note   CSN: [id number] Arrival date & time: 09/19/19  1629     History   Chief Complaint Chief Complaint  Patient presents with   Flank Pain    HPI Kristin [name] is a 53 y.o. female history of previous kidney stone here presenting with fever, right flank pain, dysuria.  Patient states that she has right flank pain since yesterday.  She went to urgent care and was noted to have blood in her urine and some leukocytes but was not prescribed any pain medicine.  Patient states that overnight, she developed a fever of 102 at home.  She also had worsening right flank pain and nausea.  She been taking Tylenol but the fever persisted.     The history is provided by the patient.    Past Medical History:  Diagnosis Date   Anxiety    Balance problem    Bilateral posterior subcapsular polar cataract    Depression, major    Dizziness    DVT (deep venous thrombosis) (HCC)    GERD (gastroesophageal reflux disease)    Neuropathy    Renal calculi     Patient Active Problem List   Diagnosis Date Noted   Sepsis secondary to UTI (HCC) 09/19/2019   Disturbance of skin sensation 03/04/2014   Memory change 03/04/2014    Past Surgical History:  Procedure Laterality Date   CESAREAN SECTION     LITHOTRIPSY       OB History   No obstetric history on file.      Home Medications    Prior to Admission medications   Medication Sig Start Date End Date Taking? Authorizing Provider  ARIPiprazole (ABILIFY) 5 MG tablet Take 5 mg by mouth daily.    [provider]  aspirin 325 MG tablet Take 325 mg by mouth daily.    [provider]  buPROPion (WELLBUTRIN XL) 300 MG 24 hr tablet Take 1 tablet by mouth daily. 02/26/17   [provider]  Calcium-Magnesium-Vitamin D (CALCIUM 500 PO) Take 1 capsule by mouth daily.    [provider]  cephALEXin (KEFLEX) 500 MG capsule Take 1 capsule (500 mg  total) by mouth 3 (three) times daily. 04/26/17   Devoria Albe, MD  clonazePAM (KLONOPIN) 0.5 MG tablet Take 0.5-0.75 mg by mouth 2 (two) times daily as needed for anxiety.     [provider]  divalproex (DEPAKOTE ER) 500 MG 24 hr tablet Take 500 mg by mouth daily.     [provider]  docusate sodium (COLACE) 100 MG capsule Take 100 mg by mouth daily as needed for mild constipation or moderate constipation.     [provider]  lamoTRIgine (LAMICTAL) 100 MG tablet Take 150 mg by mouth daily.  03/03/14   [provider]  Multiple Vitamin (MULTIVITAMIN) tablet Take 1 tablet by mouth daily.    [provider]  verapamil (CALAN-SR) 120 MG CR tablet Take 1 tablet (120 mg total) by mouth at bedtime. Patient not taking: Reported on 08/08/2015 05/03/15   York Spaniel, MD  vitamin B-12 (CYANOCOBALAMIN) 100 MCG tablet Take 100 mcg by mouth daily.    [provider]  vitamin C (ASCORBIC ACID) 250 MG tablet Take 250 mg by mouth daily.    [provider]    Family History Family History  Problem Relation Age of Onset   Depression Sister    Cancer Sister        thyroid  Multiple sclerosis Cousin     Social History Social History   Tobacco Use   Smoking status: Former Smoker   Smokeless tobacco: Never Used  Substance Use Topics   Alcohol use: Yes    Comment: Two drinks about every 2-4 weeks    Drug use: No     Allergies   Patient has no known allergies.   Review of Systems Review of Systems  Genitourinary: Positive for flank pain.  All other systems reviewed and are negative.    Physical Exam Updated Vital Signs BP 126/77    Pulse (!) 106    Temp 99.7 F (37.6 C) (Oral)    Resp 14    Ht 5\' 5"  (1.651 m)    Wt 74.8 kg    SpO2 99%    BMI 27.44 kg/m   Physical Exam Vitals signs and nursing note reviewed.  Constitutional:      Comments: Uncomfortable   HENT:     Head: Normocephalic.     Nose: Nose normal.      Mouth/Throat:     Mouth: Mucous membranes are dry.  Eyes:     Extraocular Movements: Extraocular movements intact.     Pupils: Pupils are equal, round, and reactive to light.  Neck:     Musculoskeletal: Normal range of motion.  Cardiovascular:     Rate and Rhythm: Regular rhythm. Tachycardia present.     Comments: Tachycardic  Pulmonary:     Effort: Pulmonary effort is normal.     Breath sounds: Normal breath sounds.  Abdominal:     General: Abdomen is flat.     Palpations: Abdomen is soft.     Comments: + R CVAT   Musculoskeletal: Normal range of motion.  Skin:    General: Skin is warm.     Capillary Refill: Capillary refill takes less than 2 seconds.  Neurological:     General: No focal deficit present.     Mental Status: She is alert and oriented to person, place, and time.  Psychiatric:        Mood and Affect: Mood normal.        Behavior: Behavior normal.      ED Treatments / Results  Labs (all labs ordered are listed, but only abnormal results are displayed) Labs Reviewed  URINALYSIS, ROUTINE W REFLEX MICROSCOPIC - Abnormal; Notable for the following components:      Result Value   APPearance CLOUDY (*)    Hgb urine dipstick MODERATE (*)    Protein, ur 30 (*)    Leukocytes,Ua LARGE (*)    WBC, UA >50 (*)    All other components within normal limits  BASIC METABOLIC PANEL - Abnormal; Notable for the following components:   Glucose, Bld 129 (*)    Creatinine, Ser 1.01 (*)    All other components within normal limits  CBC - Abnormal; Notable for the following components:   WBC 14.7 (*)    Hemoglobin 15.5 (*)    All other components within normal limits  HEPATIC FUNCTION PANEL - Abnormal; Notable for the following components:   Total Protein 6.1 (*)    Albumin 3.4 (*)    All other components within normal limits  SARS CORONAVIRUS 2 BY RT PCR (HOSPITAL ORDER, [facility])  CULTURE, BLOOD (ROUTINE X 2)  CULTURE, BLOOD (ROUTINE X 2)    URINE CULTURE  URINE CULTURE  URINE CULTURE  LACTIC ACID, PLASMA  LACTIC ACID, PLASMA  HIV ANTIBODY (  ROUTINE TESTING W REFLEX)  CBC  CREATININE, SERUM  BASIC METABOLIC PANEL  CBC  I-STAT BETA HCG BLOOD, ED ([facility], [facility], [facility] ONLY)    EKG EKG Interpretation  Date/Time:  Saturday September 19 2019 17:50:22 EST Ventricular Rate:  125 PR Interval:    QRS Duration: 76 QT Interval:  289 QTC Calculation: 417 R Axis:   90 Text Interpretation: Sinus tachycardia Atrial premature complex Probable left atrial enlargement Borderline right axis deviation Since last tracing rate faster Confirmed by Richardean CanalYao, Giah Fickett H [phone]([id number]) on 09/19/2019 6:35:26 PM   Radiology Dg Chest 2 View  Result Date: 09/19/2019 CLINICAL DATA:  Flank pain EXAM: CHEST - 2 VIEW COMPARISON:  None. FINDINGS: Lungs are clear.  No pleural effusion or pneumothorax. The heart is normal in size. Visualized osseous structures are within normal limits. IMPRESSION: Normal chest radiographs. Electronically Signed   By: Charline BillsSriyesh  Krishnan M.D.   On: 09/19/2019 18:03   Dg C-arm 1-60 Min-no Report  Result Date: 09/19/2019 Fluoroscopy was utilized by the requesting physician.  No radiographic interpretation.   Ct Renal Stone Study  Result Date: 09/19/2019 CLINICAL DATA:  Right flank pain and right groin pain. Hx of kidney stones. EXAM: CT ABDOMEN AND PELVIS WITHOUT CONTRAST TECHNIQUE: Multidetector CT imaging of the abdomen and pelvis was performed following the standard protocol without IV contrast. COMPARISON:  None. FINDINGS: Lower chest: No acute abnormality. Hepatobiliary: No focal liver abnormality is seen. No gallstones, gallbladder wall thickening, or biliary dilatation. Pancreas: Unremarkable. Spleen: Normal in size without focal abnormality. Adrenals/Urinary Tract: Adrenal glands are unremarkable. Multiple small left renal calculi no hydronephrosis. The right kidney is mildly enlarged with perinephric stranding. There is moderate right  hydronephrosis secondary to a 1.0 cm calculus at the right UPJ. No additional calculi in the right kidney. Urinary bladder is unremarkable. Stomach/Bowel: Stomach is within normal limits. Appendix appears normal. No evidence of bowel wall thickening, distention, or inflammatory changes. Scattered colonic diverticula without evidence of diverticulitis. Vascular/Lymphatic: No significant vascular findings are present. No enlarged abdominal or pelvic lymph nodes. Reproductive: Uterus and bilateral adnexa are unremarkable. Other: No abdominal wall hernia or abnormality. No abdominopelvic ascites. Musculoskeletal: No acute or significant osseous findings. IMPRESSION: 1. Obstructing 1.0 cm calculus at the right UPJ with associated moderate right hydronephrosis and perinephric stranding. 2. Multiple nonobstructing left renal calculi. Electronically Signed   By: Emmaline KluverNancy  Ballantyne M.D.   On: 09/19/2019 17:47    Procedures Procedures (including critical care time)  CRITICAL CARE Performed by: Richardean Canalavid H Lakethia Coppess   Total critical care time: 30 minutes  Critical care time was exclusive of separately billable procedures and treating other patients.  Critical care was necessary to treat or prevent imminent or life-threatening deterioration.  Critical care was time spent personally by me on the following activities: development of treatment plan with patient and/or surrogate as well as nursing, discussions with consultants, evaluation of patient's response to treatment, examination of patient, obtaining history from patient or surrogate, ordering and performing treatments and interventions, ordering and review of laboratory studies, ordering and review of radiographic studies, pulse oximetry and re-evaluation of patient's condition.   Medications Ordered in ED Medications  aspirin tablet 325 mg (has no administration in time range)  cefTRIAXone (ROCEPHIN) 1 g in sodium chloride 0.9 % 100 mL IVPB (has no  administration in time range)  ARIPiprazole (ABILIFY) tablet 5 mg (has no administration in time range)  buPROPion (WELLBUTRIN XL) 24 hr tablet 300 mg (has no administration in time range)  docusate sodium (COLACE) capsule 100 mg (has no administration in time range)  vitamin B-12 (CYANOCOBALAMIN) tablet 100 mcg (has no administration in time range)  clonazePAM (KLONOPIN) tablet 0.25 mg (has no administration in time range)  divalproex (DEPAKOTE ER) 24 hr tablet 500 mg (has no administration in time range)  lamoTRIgine (LAMICTAL) tablet 150 mg (has no administration in time range)  multivitamin tablet 1 tablet (has no administration in time range)  vitamin C (ASCORBIC ACID) tablet 250 mg (has no administration in time range)  enoxaparin (LOVENOX) injection 40 mg (has no administration in time range)  0.9 %  sodium chloride infusion (has no administration in time range)  tamsulosin (FLOMAX) capsule 0.4 mg (has no administration in time range)  acetaminophen (TYLENOL) tablet 650 mg (has no administration in time range)    Or  acetaminophen (TYLENOL) suppository 650 mg (has no administration in time range)  HYDROcodone-acetaminophen (NORCO/VICODIN) 5-325 MG per tablet 1-2 tablet (has no administration in time range)  docusate sodium (COLACE) capsule 100 mg (has no administration in time range)  ondansetron (ZOFRAN) tablet 4 mg (has no administration in time range)    Or  ondansetron (ZOFRAN) injection 4 mg (has no administration in time range)  acetaminophen (OFIRMEV) 10 MG/ML IV (has no administration in time range)  acetaminophen (TYLENOL) tablet 650 mg (650 mg Oral Given 09/19/19 1815)  sodium chloride 0.9 % bolus 1,000 mL (1,000 mLs Intravenous New Bag/Given (Non-Interop) 09/19/19 1824)  ondansetron (ZOFRAN) injection 4 mg (4 mg Intravenous Given 09/19/19 1824)  morphine 4 MG/ML injection 4 mg (4 mg Intravenous Given 09/19/19 1824)  sodium chloride 0.9 % bolus 1,000 mL (1,000 mLs Intravenous  New Bag/Given (Non-Interop) 09/19/19 1837)  cefTRIAXone (ROCEPHIN) 2 g in sodium chloride 0.9 % 100 mL IVPB (2 g Intravenous New Bag/Given (Non-Interop) 09/19/19 1937)  morphine 4 MG/ML injection 4 mg (4 mg Intravenous Given 09/19/19 1937)  fentaNYL (SUBLIMAZE) 100 MCG/2ML injection (has no administration in time range)  propofol (DIPRIVAN) 10 mg/mL bolus/IV push (has no administration in time range)  midazolam (VERSED) 2 MG/2ML injection (has no administration in time range)  lidocaine 20 MG/ML injection (has no administration in time range)  dexamethasone (DECADRON) 10 MG/ML injection (has no administration in time range)  ondansetron (ZOFRAN) 4 MG/2ML injection (has no administration in time range)     Initial Impression / Assessment and Plan / ED Course  I have reviewed the triage vital signs and the nursing notes.  Pertinent labs & imaging results that were available during my care of the patient were reviewed by me and considered in my medical decision making (see chart for details).       Jaidon Sponsel is a 53 y.o. female here with right flank pain and fever.  I am concerned for possible infected kidney stone.  She is febrile and tachycardic. Will get labs, UA, CT renal stone  6:30 pm WBC is 14. CT showed obstructing 1 cm R UPJ stone with hydro. UA + UTI. Given rocephin and IVF. Talked to [facility] from urology, who recommend emergent stent and medicine admission. Requesting rapid COVID test that is approved by Dr. Ninetta Lights. Hospitalist to admit for sepsis, infected kidney stone    Final Clinical Impressions(s) / ED Diagnoses   Final diagnoses:  Sepsis, due to unspecified organism, unspecified whether acute organ dysfunction present [facility])  Kidney stone  Pyelonephritis    ED Discharge Orders    None       Charlynne Pander, MD 09/19/19  2211 ° °

## 2019-09-19 NOTE — ED Notes (Signed)
ED Provider at bedside. 

## 2019-09-19 NOTE — ED Notes (Signed)
Urology at bedside.

## 2019-09-19 NOTE — H&P (Signed)
History and Physical    Arieliz Latino EQA:[id number] DOB: 09-Apr-1966 DOA: 09/19/2019  PCP: [name], PA-C  Patient coming from: home  I have personally briefly reviewed patient's old medical records in [facility]  Chief Complaint: flank pain, fevers  HPI: Kristin [name] is a 53 y.o. female with medical history significant of nephrolithiasis, depression, anxiety, DVT and GERD who presents with fevers and flank pain.  Patient reports onset of symptoms about 1 week ago with right flank pain was intermittent.  She states she had her typical pain associated with kidney stones.  She gets episodes approximately twice a year and for several years now.  She reports she noticed decreased urine output for the past week.  She denies any blood in her urine.  She had a little bit of dysuria.  She had presented to urgent care yesterday due to worsening pain.  However she did not have any fevers at that time.  Upon returning home she started to develop new onset fevers, severe chills, reported rigors.  She states she was managing pain with Tylenol alone.  She reports 1 g of Tylenol did assist with taking off the edge.  She denies knowing what type of stone she has had in the past, reports her prior urologist was doing studies but left the area.  She denies any history of Crohn's or other predisposing factors that she knows of for nephrolithiasis.  She is not on any supplements that would increase her risk.  She has not been on any antibiotics at home for this yet, though has a prescription for cephalexin.  Besides the fevers and pain, she had some nausea and did keep down oral intake this morning but has not eaten anything since.  She has Flomax as needed but has not used it this week.  No history of tobacco use, social and occasional alcohol use, no drug use   Review of Systems: As per HPI otherwise 10 point review of systems negative.   Past Medical History:  Diagnosis Date   Anxiety     Balance problem    Bilateral posterior subcapsular polar cataract    Depression, major    Dizziness    DVT (deep venous thrombosis) (HCC)    GERD (gastroesophageal reflux disease)    Neuropathy    Renal calculi     Past Surgical History:  Procedure Laterality Date   CESAREAN SECTION     LITHOTRIPSY       reports that she has quit smoking. She has never used smokeless tobacco. She reports current alcohol use. She reports that she does not use drugs.  No Known Allergies  Family History  Problem Relation Age of Onset   Depression Sister    Cancer Sister        thyroid   Multiple sclerosis Cousin     Prior to Admission medications   Medication Sig Start Date End Date Taking? Authorizing Provider  ARIPiprazole (ABILIFY) 5 MG tablet Take 5 mg by mouth daily.    [provider]  aspirin 325 MG tablet Take 325 mg by mouth daily.    [provider]  buPROPion (WELLBUTRIN XL) 300 MG 24 hr tablet Take 1 tablet by mouth daily. 02/26/17   [provider]  Calcium-Magnesium-Vitamin D (CALCIUM 500 PO) Take 1 capsule by mouth daily.    [provider]  cephALEXin (KEFLEX) 500 MG capsule Take 1 capsule (500 mg total) by mouth 3 (three) times daily. 04/26/17   Devoria Albe,  MD  clonazePAM (KLONOPIN) 0.5 MG tablet Take 0.5-0.75 mg by mouth 2 (two) times daily as needed for anxiety.     [provider]  divalproex (DEPAKOTE ER) 500 MG 24 hr tablet Take 500 mg by mouth daily.     [provider]  docusate sodium (COLACE) 100 MG capsule Take 100 mg by mouth daily as needed for mild constipation or moderate constipation.     [provider]  lamoTRIgine (LAMICTAL) 100 MG tablet Take 150 mg by mouth daily.  03/03/14   [provider]  Multiple Vitamin (MULTIVITAMIN) tablet Take 1 tablet by mouth daily.    [provider]  verapamil (CALAN-SR) 120 MG CR tablet Take 1 tablet (120 mg total) by mouth at  bedtime. Patient not taking: Reported on 08/08/2015 05/03/15   York SpanielWillis, Charles K, MD  vitamin B-12 (CYANOCOBALAMIN) 100 MCG tablet Take 100 mcg by mouth daily.    [provider]  vitamin C (ASCORBIC ACID) 250 MG tablet Take 250 mg by mouth daily.    [provider]    Physical Exam: Vitals:   09/19/19 1645 09/19/19 1814 09/19/19 1907 09/19/19 1915  BP: (!) 148/118 (!) 144/81 127/85 119/61  Pulse: (!) 154 (!) 125 (!) 121 (!) 113  Resp: (!) 26 (!) 21 18 17   Temp: (!) 100.7 F (38.2 C)     TempSrc: Oral     SpO2: 95% 98% 95% 94%    Constitutional: NAD, calm, comfortable Vitals:   09/19/19 1645 09/19/19 1814 09/19/19 1907 09/19/19 1915  BP: (!) 148/118 (!) 144/81 127/85 119/61  Pulse: (!) 154 (!) 125 (!) 121 (!) 113  Resp: (!) 26 (!) 21 18 17   Temp: (!) 100.7 F (38.2 C)     TempSrc: Oral     SpO2: 95% 98% 95% 94%   General: Pleasant currently resting in no apparent distress Eyes: EOMI, anicteric sclera ENMT: Mucous membranes are moist. Posterior pharynx clear of any exudate or lesions.Normal dentition.  Neck: normal, supple, no masses, no thyromegaly Respiratory: clear to auscultation bilaterally, no wheezing, no crackles. Normal respiratory effort. No accessory muscle use.  Cardiovascular: Tachycardic rate and rhythm, no murmurs / rubs / gallops. No extremity edema. 2+ pedal pulses.  Abdomen: no tenderness, no masses palpated. No hepatosplenomegaly. Bowel sounds positive. Genitourinary: Right-sided CVA tenderness per prior exam/did not reperform due to pain, no suprapubic tenderness Musculoskeletal: no clubbing / cyanosis. No joint deformity upper and lower extremities. Good ROM, no contractures. Normal muscle tone.  Skin: no rashes, lesions, ulcers. No induration Neurologic: CN 2-12 grossly intact.  Strength and sensation grossly intact Psychiatric: Normal judgment and insight. Alert and oriented x 3. Normal mood.   Labs on Admission: I have personally  reviewed following labs and imaging studies  CBC: Recent Labs  Lab 09/19/19 1654  WBC 14.7*  HGB 15.5*  HCT 45.1  MCV 93.6  PLT 248   Basic Metabolic Panel: Recent Labs  Lab 09/19/19 1654  NA 136  K 3.9  CL 104  CO2 22  GLUCOSE 129*  BUN 13  CREATININE 1.01*  CALCIUM 10.1   GFR: CrCl cannot be calculated (Unknown ideal weight.). Liver Function Tests: No results for input(s): AST, ALT, ALKPHOS, BILITOT, PROT, ALBUMIN in the last 168 hours. No results for input(s): LIPASE, AMYLASE in the last 168 hours. No results for input(s): AMMONIA in the last 168 hours. Coagulation Profile: No results for input(s): INR, PROTIME in the last 168 hours. Cardiac Enzymes: No results for  input(s): CKTOTAL, CKMB, CKMBINDEX, TROPONINI in the last 168 hours. BNP (last 3 results) No results for input(s): PROBNP in the last 8760 hours. HbA1C: No results for input(s): HGBA1C in the last 72 hours. CBG: No results for input(s): GLUCAP in the last 168 hours. Lipid Profile: No results for input(s): CHOL, HDL, LDLCALC, TRIG, CHOLHDL, LDLDIRECT in the last 72 hours. Thyroid Function Tests: No results for input(s): TSH, T4TOTAL, FREET4, T3FREE, THYROIDAB in the last 72 hours. Anemia Panel: No results for input(s): VITAMINB12, FOLATE, FERRITIN, TIBC, IRON, RETICCTPCT in the last 72 hours. Urine analysis:    Component Value Date/Time   COLORURINE YELLOW 09/19/2019 1654   APPEARANCEUR CLOUDY (A) 09/19/2019 1654   LABSPEC 1.016 09/19/2019 1654   PHURINE 6.0 09/19/2019 1654   GLUCOSEU NEGATIVE 09/19/2019 1654   HGBUR MODERATE (A) 09/19/2019 1654   BILIRUBINUR NEGATIVE 09/19/2019 1654   KETONESUR NEGATIVE 09/19/2019 1654   PROTEINUR 30 (A) 09/19/2019 1654   NITRITE NEGATIVE 09/19/2019 1654   LEUKOCYTESUR LARGE (A) 09/19/2019 1654    Radiological Exams on Admission: Dg Chest 2 View  Result Date: 09/19/2019 CLINICAL DATA:  Flank pain EXAM: CHEST - 2 VIEW COMPARISON:  None. FINDINGS: Lungs  are clear.  No pleural effusion or pneumothorax. The heart is normal in size. Visualized osseous structures are within normal limits. IMPRESSION: Normal chest radiographs. Electronically Signed   By: Julian Hy M.D.   On: 09/19/2019 18:03   Ct Renal Stone Study  Result Date: 09/19/2019 CLINICAL DATA:  Right flank pain and right groin pain. Hx of kidney stones. EXAM: CT ABDOMEN AND PELVIS WITHOUT CONTRAST TECHNIQUE: Multidetector CT imaging of the abdomen and pelvis was performed following the standard protocol without IV contrast. COMPARISON:  None. FINDINGS: Lower chest: No acute abnormality. Hepatobiliary: No focal liver abnormality is seen. No gallstones, gallbladder wall thickening, or biliary dilatation. Pancreas: Unremarkable. Spleen: Normal in size without focal abnormality. Adrenals/Urinary Tract: Adrenal glands are unremarkable. Multiple small left renal calculi no hydronephrosis. The right kidney is mildly enlarged with perinephric stranding. There is moderate right hydronephrosis secondary to a 1.0 cm calculus at the right UPJ. No additional calculi in the right kidney. Urinary bladder is unremarkable. Stomach/Bowel: Stomach is within normal limits. Appendix appears normal. No evidence of bowel wall thickening, distention, or inflammatory changes. Scattered colonic diverticula without evidence of diverticulitis. Vascular/Lymphatic: No significant vascular findings are present. No enlarged abdominal or pelvic lymph nodes. Reproductive: Uterus and bilateral adnexa are unremarkable. Other: No abdominal wall hernia or abnormality. No abdominopelvic ascites. Musculoskeletal: No acute or significant osseous findings. IMPRESSION: 1. Obstructing 1.0 cm calculus at the right UPJ with associated moderate right hydronephrosis and perinephric stranding. 2. Multiple nonobstructing left renal calculi. Electronically Signed   By: Audie Pinto M.D.   On: 09/19/2019 17:47     Assessment/Plan Tymira Horkey is a 53 y.o. female with medical history significant of nephrolithiasis, depression, anxiety, DVT and GERD who presents with fevers and flank pain with sepsis secondary to pyelonephritis in the setting of an obstructed kidney stone.  #Sepsis 2/2 pyelonephritis and infected obstructive nephrolithiasis #Moderate right-sided hydronephrosis -Leukocytosis, fevers, tachycardia, tachypnea.  Normal lactate -Patient with prior history of nephrolithiasis, 1 week of symptoms consistent with renal colic, CT scan with 1 cm stone at right UPJ with moderate right-sided hydronephrosis and perinephric stranding.  She has nonobstructing left renal calculi. -Urology is consulted and will plan to place stent this evening, appreciate consultation from Dr. Fredderick Phenix -continue ceftriaxone and follow-up urine cultures -Continue IV  fluids, Flomax, pain control with as needed bowel regimen  #Anxiety and depression -Continue prior to admission medications including bupropion, lamotrigine, Depakote, as needed clonazepam -Should follow with psychiatry on discharge  #Chart history of DVT -Not on anticoagulation no symptoms concerning for acute DVT  DVT prophylaxis: Lovenox tomorrow Code Status: Full Family Communication: Husband at bedside and aware of plan Consults called: Urology, Dr. Carylon Perches Admission status: Inpatient, plan for stent this evening   Clydia Llano MD [facility] Pager [phone]  If 2AM-7AM, please contact night-coverage [email or web address] Password [email or web address]  09/19/2019, 8:02 PM

## 2019-09-19 NOTE — H&P (View-Only) (Signed)
Urology Consult Note   Requesting Attending Physician:  Charlynne Pander, MD Service Providing Consult: Urology   Reason for Consult:  Right obstructing infected stone  HPI: Kristin [name] is seen in consultation for reasons noted above at the request of Charlynne Pander, MD for evaluation of obstructing ureteral calculi in the setting of infection.  This is a 53 y.o. female with GERD, MDD, anxiety, nephrolithasis who presents with 7 days of right flank pain progressing to fever today. Fever of 102.9 at home. Mild nausea, no emesis. Mild dysuria, +frequency, +urgency, -incomplete emptying, -hemautira.   Was seen yesterday elsewhere and was not started on antibiotics; she reports only found blood on UA.   CT with 44mm right UPJ stone with proximal hydronephrosis and perinephric stranding.   Has history of stones including two prior surgeries at [facility]. Has done 24hr urine studies.   No obvious lithogenic medications, no prior GI surgeries, no gout.    Past Medical History: Past Medical History:  Diagnosis Date   Anxiety    Balance problem    Bilateral posterior subcapsular polar cataract    Depression, major    Dizziness    DVT (deep venous thrombosis) (HCC)    GERD (gastroesophageal reflux disease)    Neuropathy    Renal calculi     Past Surgical History:  Past Surgical History:  Procedure Laterality Date   CESAREAN SECTION     LITHOTRIPSY      Medication: Current Facility-Administered Medications  Medication Dose Route Frequency Provider Last Rate Last Dose   cefTRIAXone (ROCEPHIN) 2 g in sodium chloride 0.9 % 100 mL IVPB  2 g Intravenous Once Charlynne Pander, MD       morphine 4 MG/ML injection 4 mg  4 mg Intravenous Once Charlynne Pander, MD       Current Outpatient Medications  Medication Sig Dispense Refill   ARIPiprazole (ABILIFY) 5 MG tablet Take 5 mg by mouth daily.     aspirin 325 MG tablet Take 325 mg by mouth daily.      buPROPion (WELLBUTRIN XL) 300 MG 24 hr tablet Take 1 tablet by mouth daily.     Calcium-Magnesium-Vitamin D (CALCIUM 500 PO) Take 1 capsule by mouth daily.     cephALEXin (KEFLEX) 500 MG capsule Take 1 capsule (500 mg total) by mouth 3 (three) times daily. 21 capsule 0   clonazePAM (KLONOPIN) 0.5 MG tablet Take 0.5-0.75 mg by mouth 2 (two) times daily as needed for anxiety.      divalproex (DEPAKOTE ER) 500 MG 24 hr tablet Take 500 mg by mouth daily.      docusate sodium (COLACE) 100 MG capsule Take 100 mg by mouth daily as needed for mild constipation or moderate constipation.      lamoTRIgine (LAMICTAL) 100 MG tablet Take 150 mg by mouth daily.      Multiple Vitamin (MULTIVITAMIN) tablet Take 1 tablet by mouth daily.     verapamil (CALAN-SR) 120 MG CR tablet Take 1 tablet (120 mg total) by mouth at bedtime. (Patient not taking: Reported on 08/08/2015) 30 tablet 3   vitamin B-12 (CYANOCOBALAMIN) 100 MCG tablet Take 100 mcg by mouth daily.     vitamin C (ASCORBIC ACID) 250 MG tablet Take 250 mg by mouth daily.      Allergies: No Known Allergies  Social History: Social History   Tobacco Use   Smoking status: Former Smoker   Smokeless tobacco: Never Used  Substance Use Topics  Alcohol use: Yes  °  Comment: Two drinks about every 2-4 weeks   °• Drug use: No  ° ° °Family History °Family History  °Problem Relation Age of Onset  °• Depression Sister   °• Cancer Sister   °     thyroid  °• Multiple sclerosis Cousin   ° ° °Review of Systems °10 systems were reviewed and are negative except as noted specifically in the HPI. ° °Objective  ° °Vital signs in last 24 hours: °BP 127/85    Pulse (!) 121    Temp (!) 100.7 °F (38.2 °C) (Oral)    Resp 18    SpO2 95%  ° °Physical Exam °General: NAD, A&O, resting, appropriate °HEENT: [place]/AT, EOMI, MMM °Pulmonary: Normal work of breathing °Cardiovascular: HDS, adequate peripheral perfusion °Abdomen: Soft, NTTP, nondistended. °GU: RIGHT flank  pain/tenderness °Extremities: warm and well perfused °Neuro: Appropriate, no focal neurological deficits ° °Most Recent Labs: °Lab Results  °Component Value Date  ° WBC 14.7 (H) 09/19/2019  ° HGB 15.5 (H) 09/19/2019  ° HCT 45.1 09/19/2019  ° PLT 248 09/19/2019  ° ° °Lab Results  °Component Value Date  ° NA 136 09/19/2019  ° K 3.9 09/19/2019  ° CL 104 09/19/2019  ° CO2 22 09/19/2019  ° BUN 13 09/19/2019  ° CREATININE 1.01 (H) 09/19/2019  ° CALCIUM 10.1 09/19/2019  ° ° °No results found for: INR, APTT ° ° °Urine Culture: °@[id number](laburin,org,[id number],[id number])@  ° °IMAGING: °Dg Chest 2 View ° °Result Date: 09/19/2019 °CLINICAL DATA:  Flank pain EXAM: CHEST - 2 VIEW COMPARISON:  None. FINDINGS: Lungs are clear.  No pleural effusion or pneumothorax. The heart is normal in size. Visualized osseous structures are within normal limits. IMPRESSION: Normal chest radiographs. Electronically Signed   By: Sriyesh  Krishnan M.D.   On: 09/19/2019 18:03  ° °Ct Renal Stone Study ° °Result Date: 09/19/2019 °CLINICAL DATA:  Right flank pain and right groin pain. Hx of kidney stones. EXAM: CT ABDOMEN AND PELVIS WITHOUT CONTRAST TECHNIQUE: Multidetector CT imaging of the abdomen and pelvis was performed following the standard protocol without IV contrast. COMPARISON:  None. FINDINGS: Lower chest: No acute abnormality. Hepatobiliary: No focal liver abnormality is seen. No gallstones, gallbladder wall thickening, or biliary dilatation. Pancreas: Unremarkable. Spleen: Normal in size without focal abnormality. Adrenals/Urinary Tract: Adrenal glands are unremarkable. Multiple small left renal calculi no hydronephrosis. The right kidney is mildly enlarged with perinephric stranding. There is moderate right hydronephrosis secondary to a 1.0 cm calculus at the right UPJ. No additional calculi in the right kidney. Urinary bladder is unremarkable. Stomach/Bowel: Stomach is within normal limits. Appendix appears normal. No evidence of bowel wall  thickening, distention, or inflammatory changes. Scattered colonic diverticula without evidence of diverticulitis. Vascular/Lymphatic: No significant vascular findings are present. No enlarged abdominal or pelvic lymph nodes. Reproductive: Uterus and bilateral adnexa are unremarkable. Other: No abdominal wall hernia or abnormality. No abdominopelvic ascites. Musculoskeletal: No acute or significant osseous findings. IMPRESSION: 1. Obstructing 1.0 cm calculus at the right UPJ with associated moderate right hydronephrosis and perinephric stranding. 2. Multiple nonobstructing left renal calculi. Electronically Signed   By: Nancy  Ballantyne M.D.   On: 09/19/2019 17:47  ° °------ ° °Assessment: ° °53 y.o. female with GERD, MDD, anxiety, nephrolithasis who presents with 12mm right UPJ stone with hydronephrosis and infection.  ° °Febrile (102.9 at home). Tachycardic. Normotensive fortunately. WBC 14.7. Lactate 1.4. Creatinine 1.0 from 0.8. UA with pyuria. CT with 12mm right UPJ stone with proximal hydronephrosis and   perinephric stranding.   Will plan for right ureteral stent for urinary decompression. Discussed risks and benefits, she agrees. Discussed possible inability to stent in which case PCN would be recommended, she agrees.   Recommendations: - Keep NPO - COVID test STAT - Agree with 2g CTX - To OR tonight for right ureteral stent - Appreciate hospitalist assistance   Thank you for this consult. Please contact the urology consult pager with any further questions/concerns.

## 2019-09-19 NOTE — ED Notes (Signed)
Lab called to collect second set of cultures.

## 2019-09-19 NOTE — Anesthesia Postprocedure Evaluation (Signed)
Anesthesia Post Note  Patient: Kristin [name]  Procedure(s) Performed: CYSTOSCOPY WITH RETROGRADE PYELOGRAM/URETERAL STENT PLACEMENT (Right )     Patient location during evaluation: PACU Anesthesia Type: General Level of consciousness: awake and alert Pain management: pain level controlled Vital Signs Assessment: post-procedure vital signs reviewed and stable Respiratory status: spontaneous breathing, nonlabored ventilation, respiratory function stable and patient connected to nasal cannula oxygen Cardiovascular status: blood pressure returned to baseline and stable Postop Assessment: no apparent nausea or vomiting Anesthetic complications: no    Last Vitals:  Vitals:   09/19/19 1907 09/19/19 1915  BP: 127/85 119/61  Pulse: (!) 121 (!) 113  Resp: 18 17  Temp:    SpO2: 95% 94%    Last Pain:  Vitals:   09/19/19 1909  TempSrc:   PainSc: [place]

## 2019-09-19 NOTE — ED Notes (Signed)
Family at bedside. 

## 2019-09-19 NOTE — Anesthesia Procedure Notes (Signed)
Procedure Name: LMA Insertion Date/Time: 09/19/2019 8:45 PM Performed by: Emunah Texidor D, CRNA Pre-anesthesia Checklist: Patient identified, Emergency Drugs available, Suction available and Patient being monitored Patient Re-evaluated:Patient Re-evaluated prior to induction Oxygen Delivery Method: Circle system utilized Preoxygenation: Pre-oxygenation with 100% oxygen Induction Type: IV induction Ventilation: Mask ventilation without difficulty LMA: LMA inserted LMA Size: 4.0 Tube type: Oral Number of attempts: 1 Placement Confirmation: positive ETCO2 and breath sounds checked- equal and bilateral Tube secured with: Tape Dental Injury: Teeth and Oropharynx as per pre-operative assessment

## 2019-09-19 NOTE — ED Notes (Signed)
Hospitalist at bedside 

## 2019-09-19 NOTE — Anesthesia Preprocedure Evaluation (Addendum)
Anesthesia Evaluation  Patient identified by MRN, date of birth, ID band Patient awake    Reviewed: Allergy & Precautions, NPO status , Patient's Chart, lab work & pertinent test results  Airway Mallampati: II  TM Distance: >3 FB Neck ROM: Full    Dental no notable dental hx. (+) Teeth Intact   Pulmonary former smoker,    Pulmonary exam normal breath sounds clear to auscultation       Cardiovascular Exercise Tolerance: Good + DVT  Normal cardiovascular exam Rhythm:Regular Rate:Normal  09/19/19 EKG ST -125   Neuro/Psych PSYCHIATRIC DISORDERS Anxiety Depression negative neurological ROS     GI/Hepatic Neg liver ROS, GERD  ,  Endo/Other  negative endocrine ROS  Renal/GU Nephrolithiasis K+ 3.9 Cr 1.01     Musculoskeletal negative musculoskeletal ROS (+)   Abdominal   Peds  Hematology Hgb 15.5 Plt 240   Anesthesia Other Findings   Reproductive/Obstetrics negative OB ROS                            Anesthesia Physical Anesthesia Plan  ASA: II and emergent  Anesthesia Plan: General   Post-op Pain Management:    Induction: Intravenous  PONV Risk Score and Plan: 4 or greater and Treatment may vary due to age or medical condition, Ondansetron, Dexamethasone and Midazolam  Airway Management Planned: LMA  Additional Equipment: None  Intra-op Plan:   Post-operative Plan:   Informed Consent: I have reviewed the patients History and Physical, chart, labs and discussed the procedure including the risks, benefits and alternatives for the proposed anesthesia with the patient or authorized representative who has indicated his/her understanding and acceptance.     Dental advisory given  Plan Discussed with: CRNA  Anesthesia Plan Comments:        Anesthesia Quick Evaluation

## 2019-09-19 NOTE — Transfer of Care (Signed)
Immediate Anesthesia Transfer of Care Note  Patient: Kristin [name]  Procedure(s) Performed: CYSTOSCOPY WITH RETROGRADE PYELOGRAM/URETERAL STENT PLACEMENT (Right )  Patient Location: PACU  Anesthesia Type:General  Level of Consciousness: awake, alert  and oriented  Airway & Oxygen Therapy: Patient Spontanous Breathing and Patient connected to nasal cannula oxygen  Post-op Assessment: Report given to RN and Post -op Vital signs reviewed and stable  Post vital signs: Reviewed and stable  Last Vitals:  Vitals Value Taken Time  BP 143/63 09/19/19 2119  Temp    Pulse 118 09/19/19 2121  Resp 15 09/19/19 2121  SpO2 97 % 09/19/19 2121  Vitals shown include unvalidated device data.  Last Pain:  Vitals:   09/19/19 1909  TempSrc:   PainSc: 6          Complications: No apparent anesthesia complications

## 2019-09-19 NOTE — ED Notes (Signed)
Pt provided labeled specimen cup and culture, requested sample for U/A when possible. [employer]

## 2019-09-19 NOTE — ED Notes (Signed)
Lab at bedside

## 2019-09-19 NOTE — ED Triage Notes (Signed)
Patient states she was having right flank pain yesterday-went to UC and they said it didn't look like she had a UTI-slight blood in urine-history of kidney stones

## 2019-09-20 ENCOUNTER — Encounter (HOSPITAL_COMMUNITY): Payer: Self-pay | Admitting: Urology

## 2019-09-20 DIAGNOSIS — A419 Sepsis, unspecified organism: Principal | ICD-10-CM

## 2019-09-20 DIAGNOSIS — N12 Tubulo-interstitial nephritis, not specified as acute or chronic: Secondary | ICD-10-CM

## 2019-09-20 LAB — CBC
HCT: 42.3 % (ref 36.0–46.0)
Hemoglobin: 13.7 g/dL (ref 12.0–15.0)
MCH: 31.8 pg (ref 26.0–34.0)
MCHC: 32.4 g/dL (ref 30.0–36.0)
MCV: 98.1 fL (ref 80.0–100.0)
Platelets: 153 10*3/uL (ref 150–400)
RBC: 4.31 MIL/uL (ref 3.87–5.11)
RDW: 12.3 % (ref 11.5–15.5)
WBC: 14.7 10*3/uL — ABNORMAL HIGH (ref 4.0–10.5)
nRBC: 0 % (ref 0.0–0.2)

## 2019-09-20 LAB — BASIC METABOLIC PANEL
Anion gap: 9 (ref 5–15)
BUN: 11 mg/dL (ref 6–20)
CO2: 23 mmol/L (ref 22–32)
Calcium: 9.4 mg/dL (ref 8.9–10.3)
Chloride: 110 mmol/L (ref 98–111)
Creatinine, Ser: 0.91 mg/dL (ref 0.44–1.00)
GFR calc Af Amer: >60 mL/min (ref >60)
GFR calc non Af Amer: >60 mL/min (ref >60)
Glucose, Bld: 151 mg/dL — ABNORMAL HIGH (ref 70–99)
Potassium: 4.4 mmol/L (ref 3.5–5.1)
Sodium: 142 mmol/L (ref 135–145)

## 2019-09-20 LAB — HIV ANTIBODY (ROUTINE TESTING W REFLEX): HIV Screen 4th Generation wRfx: NONREACTIVE

## 2019-09-20 MED ORDER — VANCOMYCIN HCL 10 G IV SOLR
1500.0000 mg | INTRAVENOUS | Status: DC
  Filled 2019-09-20: qty 1500

## 2019-09-20 MED ORDER — SODIUM CHLORIDE 0.9 % IV SOLN
1.0000 g | INTRAVENOUS | Status: AC
  Administered 2019-09-20: 1 g via INTRAVENOUS
  Filled 2019-09-20: qty 1

## 2019-09-20 MED ORDER — SODIUM CHLORIDE 0.9 % IV SOLN
2.0000 g | INTRAVENOUS | Status: DC
  Administered 2019-09-21: 2 g via INTRAVENOUS
  Filled 2019-09-20: qty 2

## 2019-09-20 MED ORDER — MORPHINE SULFATE (PF) 2 MG/ML IV SOLN
1.0000 mg | INTRAVENOUS | Status: DC | PRN
  Administered 2019-09-20 – 2019-09-21 (×3): 1 mg via INTRAVENOUS
  Filled 2019-09-20 (×3): qty 1

## 2019-09-20 MED ORDER — VANCOMYCIN HCL 10 G IV SOLR
1750.0000 mg | Freq: Once | INTRAVENOUS | Status: AC
  Administered 2019-09-20: 21:00:00 1750 mg via INTRAVENOUS
  Filled 2019-09-20: qty 1750

## 2019-09-20 MED ORDER — PHENAZOPYRIDINE HCL 200 MG PO TABS
200.0000 mg | ORAL_TABLET | Freq: Three times a day (TID) | ORAL | Status: DC
  Administered 2019-09-20 – 2019-09-23 (×10): 200 mg via ORAL
  Filled 2019-09-20 (×11): qty 1

## 2019-09-20 NOTE — Progress Notes (Signed)
PHARMACY - PHYSICIAN COMMUNICATION CRITICAL VALUE ALERT - BLOOD CULTURE IDENTIFICATION (BCID)  Kristin [name] is an 53 y.o. female who presented to [facility] on 09/19/2019 with a chief complaint of sepsis secondary to UTI  Assessment:  BCID + 2/4 GPC out of same set. No BCID panel ran.  Name of physician (or Provider) Contacted: E. Ouma  Current antibiotics: Pt received ceftriaxone 2 g IV last night. Currently ordered ceftriaxone 1 g IV daily.   Changes to prescribed antibiotics recommended: Increased ceftriaxone to 2 g IV q24h, added vancomycin pending final identification and sensitivities.   No results found for this or any previous visit.  Lenis Noon, PharmD 09/20/2019  8:13 PM

## 2019-09-20 NOTE — Progress Notes (Signed)
Pharmacy Antibiotic Note  Kristin [name] is a 53 y.o. female admitted on 09/19/2019 with UTI.  Pharmacy has been consulted for vancomycin dosing.  Patient being treated for sepsis 2/2 UTI. BCID now + 2/4 GPC, no organism identified yet. Antibiotic coverage currently on ceftriaxone, broadening to add vancomycin as well.   Today, 09/20/19  WBC 14.7  SCr 0.9, CrCl ~ 70 mL/min  Plan:  Increase ceftriaxone to 2 g IV daily  Vancomycin 1750 mg LD followed by 1500 mg IV q24h  Goal vancomycin AUC 400-550  Follow renal function  Follow culture data for ability to de-escalate antibiotics  Height: 5\' 5"  (165.1 cm) Weight: 164 lb 14.4 oz (74.8 kg) IBW/kg (Calculated) : 57  Temp (24hrs), Avg:99.4 F (37.4 C), Min:97.7 F (36.5 C), Max:101.7 F (38.7 C)  Recent Labs  Lab 09/19/19 1654 09/19/19 1712 09/19/19 1932 09/20/19 0502  WBC 14.7*  --   --  14.7*  CREATININE 1.01*  --   --  0.91  LATICACIDVEN  --  1.4 0.9  --     Estimated Creatinine Clearance: 73.2 mL/min (by C-G formula based on SCr of 0.91 mg/dL).    No Known Allergies  Antimicrobials this admission: ceftriaxone 11/8 >>  vancomycin 11/8 >>   Dose adjustments this admission:  Microbiology results: 11/7 BCx: GPC 2/4 11/8 UCx: Sent  11/7 SARS-2: Negative  Thank you for allowing pharmacy to be a part of this patient's care.  Lenis Noon, PharmD 09/20/2019 8:17 PM

## 2019-09-20 NOTE — Progress Notes (Signed)
PROGRESS NOTE    Kristin [name]  XBJ:[id number] DOB: 11-Nov-1966 DOA: 09/19/2019 PCP: Kristin Pilot, PA-C   Brief Narrative:  53 year old lady with prior history of depression, anxiety, DVT, GERD, long history of nephrolithiasis presents to ED with complaints of right-sided flank pain associated with some nausea hematuria, dysuria associated with fevers and chills.  CT scan shows 1 cm stone at right UPJ with moderate right-sided hydronephrosis and perinephric stranding.  She also has nonobstructing left renal calculi.  Urology consulted and she underwent cystoscopy with retrograde pyelogram and ureteral stent placement by Dr. Alvester Morin on 09/19/2019.  She was started on Rocephin and urine cultures were sent. Assessment & Plan:   Active Problems:   Sepsis secondary to UTI (HCC)   Sepsis secondary to pyelonephritis and infected obstructive nephrolithiasis associated with moderate right-sided hydronephrosis. S/p ureteral stent placement on the right.  Continue with IV antibiotics and follow urine cultures and blood cultures.  Continue with IV fluids, Flomax and pain control.  Pyridium was added by urology today. T-max is 101 and persistent leukocytosis of 14,000. Lactate has normalized. Urology recommended outpatient follow-up in 1 week and a repeat urine culture.  Anxiety and depression continue with bupropion, lamotrigine, Depakote, as needed Klonopin.   History of DVT Not on anticoagulation at this time.   GERD Stable.   DVT prophylaxis: (Lovenox Code Status: Full code Family Communication: None at bedside Disposition Plan: Pending urinary culture report and clinical improvement and resolution of pain and improvement in pyelonephritis nephritis  Consultants:   Urology Dr. Alvester Morin  Procedures: Cystoscopy with retrograde pyelogram/ureteral stent placement by Dr. Alvester Morin on 09/19/2019. Antimicrobials: IV Rocephin.  Subjective: Patient continues to have some right-sided flank pain and pain  when she urinates.  Objective: Vitals:   09/19/19 2200 09/20/19 0112 09/20/19 0515 09/20/19 1312  BP: 126/77 101/68 108/71 139/69  Pulse: (!) 106 79 73 92  Resp: Temp: 99.7 F (37.6 C) 97.7 F (36.5 C) 97.7 F (36.5 C) 99.4 F (37.4 C)  TempSrc: Oral Oral  Oral  SpO2: 99% 99% 97% 93%  Weight: 74.8 kg     Height:  (1.651 m)       Intake/Output Summary (Last 24 hours) at 09/20/2019 1352 Last data filed at 09/20/2019 0700 Gross per 24 hour  Intake 1686.27 ml  Output 800 ml  Net 886.27 ml   Filed Weights   09/19/19 2200  Weight: 74.8 kg    Examination:  General exam: mild distress from flank distress.  Respiratory system: Clear to auscultation. Respiratory effort normal. Cardiovascular system: S1 & S2 heard, RRR. No JVD, Gastrointestinal system: Abdomen is nondistended, soft and nontender. No organomegaly or masses felt. Normal bowel sounds heard. Right sided flank pain.  Central nervous system: Alert and oriented. No focal neurological deficits. Extremities: Symmetric 5 x 5 power. Skin: No rashes, lesions or ulcers Psychiatry:  Mood & affect appropriate.     Data Reviewed: I have personally reviewed following labs and imaging studies  CBC: Recent Labs  Lab 09/19/19 1654 09/20/19 0502  WBC 14.7* 14.7*  HGB 15.5* 13.7  HCT 45.1 42.3  MCV 93.6 98.1  PLT 248 153   Basic Metabolic Panel: Recent Labs  Lab 09/19/19 1654 09/20/19 0502  NA 136 142  K 3.9 4.4  CL 104 110  CO2 22 23  GLUCOSE 129* 151*  BUN 13 11  CREATININE 1.01* 0.91  CALCIUM 10.1 9.4   GFR: Estimated Creatinine Clearance: 73.2 mL/min (by C-G formula  based on SCr of 0.91 mg/dL). Liver Function Tests: Recent Labs  Lab 09/19/19 1932  AST 40  ALT 35  ALKPHOS 85  BILITOT 0.6  PROT 6.1*  ALBUMIN 3.4*   No results for input(s): LIPASE, AMYLASE in the last 168 hours. No results for input(s): AMMONIA in the last 168 hours. Coagulation Profile: No results for input(s):  INR, PROTIME in the last 168 hours. Cardiac Enzymes: No results for input(s): CKTOTAL, CKMB, CKMBINDEX, TROPONINI in the last 168 hours. BNP (last 3 results) No results for input(s): PROBNP in the last 8760 hours. HbA1C: No results for input(s): HGBA1C in the last 72 hours. CBG: No results for input(s): GLUCAP in the last 168 hours. Lipid Profile: No results for input(s): CHOL, HDL, LDLCALC, TRIG, CHOLHDL, LDLDIRECT in the last 72 hours. Thyroid Function Tests: No results for input(s): TSH, T4TOTAL, FREET4, T3FREE, THYROIDAB in the last 72 hours. Anemia Panel: No results for input(s): VITAMINB12, FOLATE, FERRITIN, TIBC, IRON, RETICCTPCT in the last 72 hours. Sepsis Labs: Recent Labs  Lab 09/19/19 1712 09/19/19 1932  LATICACIDVEN 1.4 0.9    Recent Results (from the past 240 hour(s))  Blood culture (routine x 2)     Status: None (Preliminary result)   Collection Time: 09/19/19  5:12 PM   Specimen: BLOOD  Result Value Ref Range Status   Specimen Description   Final    BLOOD LEFT ANTECUBITAL Performed at [facility], [address], [place], [place] [address]    Special Requests   Final    BOTTLES DRAWN AEROBIC AND ANAEROBIC Blood Culture adequate volume Performed at [facility], [address], [place], [place] [address]    Culture   Final    NO GROWTH < 12 HOURS Performed at [facility], [address], [place], [place] [address]    Report Status PENDING  Incomplete  SARS Coronavirus 2 by RT PCR (hospital order, performed in [facility] hospital lab) Nasopharyngeal Nasopharyngeal Swab     Status: None   Collection Time: 09/19/19  6:39 PM   Specimen: Nasopharyngeal Swab  Result Value Ref Range Status   SARS Coronavirus 2 NEGATIVE NEGATIVE Final    Comment: (NOTE) If result is NEGATIVE SARS-CoV-2 target nucleic acids are NOT DETECTED. The SARS-CoV-2 RNA is generally detectable in upper and lower  respiratory specimens during  the acute phase of infection. The lowest  concentration of SARS-CoV-2 viral copies this assay can detect is 250  copies / mL. A negative result does not preclude SARS-CoV-2 infection  and should not be used as the sole basis for treatment or other  patient management decisions.  A negative result may occur with  improper specimen collection / handling, submission of specimen other  than nasopharyngeal swab, presence of viral mutation(s) within the  areas targeted by this assay, and inadequate number of viral copies  (<250 copies / mL). A negative result must be combined with clinical  observations, patient history, and epidemiological information. If result is POSITIVE SARS-CoV-2 target nucleic acids are DETECTED. The SARS-CoV-2 RNA is generally detectable in upper and lower  respiratory specimens dur ing the acute phase of infection.  Positive  results are indicative of active infection with SARS-CoV-2.  Clinical  correlation with patient history and other diagnostic information is  necessary to determine patient infection status.  Positive results do  not rule out bacterial infection or co-infection with other viruses. If result is PRESUMPTIVE POSTIVE SARS-CoV-2 nucleic acids MAY BE PRESENT.  A presumptive positive result was obtained on the submitted specimen  and confirmed on repeat testing.  While 2019 novel coronavirus  (SARS-CoV-2) nucleic acids may be present in the submitted sample  additional confirmatory testing may be necessary for epidemiological  and / or clinical management purposes  to differentiate between  SARS-CoV-2 and other Sarbecovirus currently known to infect humans.  If clinically indicated additional testing with an alternate test  methodology [phone]) is advised. The SARS-CoV-2 RNA is generally  detectable in upper and lower respiratory sp ecimens during the acute  phase of infection. The expected result is Negative. Fact Sheet for Patients:   StrictlyIdeas.no Fact Sheet for Healthcare Providers: BankingDealers.co.za This test is not yet approved or cleared by the [place] FDA and has been authorized for detection and/or diagnosis of SARS-CoV-2 by FDA under an Emergency Use Authorization (EUA).  This EUA will remain in effect (meaning this test can be used) for the duration of the COVID-19 declaration under Section 564(b)(1) of the Act, 21 U.S.C. section 360bbb-3(b)(1), unless the authorization is terminated or revoked sooner. Performed at [facility], [place] [address] [place], [place] [address]   Urine Culture     Status: None (Preliminary result)   Collection Time: 09/19/19  9:00 PM   Specimen: Urine, Cystoscope  Result Value Ref Range Status   Specimen Description CYSTOSCOPY  Final   Special Requests   Final    URINE PATIENT ON FOLLOWING ROCEPHIN Performed at [facility], [address] [place], [place] [address]    Culture PENDING  Incomplete   Report Status PENDING  Incomplete         Radiology Studies: Dg Chest 2 View  Result Date: 09/19/2019 CLINICAL DATA:  Flank pain EXAM: CHEST - 2 VIEW COMPARISON:  None. FINDINGS: Lungs are clear.  No pleural effusion or pneumothorax. The heart is normal in size. Visualized osseous structures are within normal limits. IMPRESSION: Normal chest radiographs. Electronically Signed   By: Julian Hy M.D.   On: 09/19/2019 18:03   Dg C-arm 1-60 Min-no Report  Result Date: 09/19/2019 Fluoroscopy was utilized by the requesting physician.  No radiographic interpretation.   Ct Renal Stone Study  Result Date: 09/19/2019 CLINICAL DATA:  Right flank pain and right groin pain. Hx of kidney stones. EXAM: CT ABDOMEN AND PELVIS WITHOUT CONTRAST TECHNIQUE: Multidetector CT imaging of the abdomen and pelvis was performed following the standard protocol without IV contrast. COMPARISON:  None. FINDINGS: Lower  chest: No acute abnormality. Hepatobiliary: No focal liver abnormality is seen. No gallstones, gallbladder wall thickening, or biliary dilatation. Pancreas: Unremarkable. Spleen: Normal in size without focal abnormality. Adrenals/Urinary Tract: Adrenal glands are unremarkable. Multiple small left renal calculi no hydronephrosis. The right kidney is mildly enlarged with perinephric stranding. There is moderate right hydronephrosis secondary to a 1.0 cm calculus at the right UPJ. No additional calculi in the right kidney. Urinary bladder is unremarkable. Stomach/Bowel: Stomach is within normal limits. Appendix appears normal. No evidence of bowel wall thickening, distention, or inflammatory changes. Scattered colonic diverticula without evidence of diverticulitis. Vascular/Lymphatic: No significant vascular findings are present. No enlarged abdominal or pelvic lymph nodes. Reproductive: Uterus and bilateral adnexa are unremarkable. Other: No abdominal wall hernia or abnormality. No abdominopelvic ascites. Musculoskeletal: No acute or significant osseous findings. IMPRESSION: 1. Obstructing 1.0 cm calculus at the right UPJ with associated moderate right hydronephrosis and perinephric stranding. 2. Multiple nonobstructing left renal calculi. Electronically Signed   By: Jac Canavan.D.  On: 09/19/2019 17:47        Scheduled Meds: . ARIPiprazole  5 mg Oral Daily  . aspirin  325 mg Oral Daily  . buPROPion  300 mg Oral Daily  . divalproex  500 mg Oral Daily  . docusate sodium  100 mg Oral BID  . enoxaparin (LOVENOX) injection  40 mg Subcutaneous Q24H  . lamoTRIgine  150 mg Oral Daily  . multivitamin with minerals  1 tablet Oral Daily  . phenazopyridine  200 mg Oral TID WC  . tamsulosin  0.4 mg Oral QPC supper  . vitamin B-12  100 mcg Oral Daily  . vitamin C  250 mg Oral Daily   Continuous Infusions: . sodium chloride 150 mL/hr at 09/20/19 1215  . cefTRIAXone (ROCEPHIN)  IV       LOS: 1 day         Kathlen ModyVijaya Tonique Mendonca, MD [facility] Pager [phone]  If 7PM-7AM, please contact night-coverage [email or web address] Password [email or web address] 09/20/2019, 1:52 PM

## 2019-09-20 NOTE — Progress Notes (Signed)
Urology Inpatient Progress Report  Kidney stone [N20.0] Pyelonephritis [N12] Sepsis, due to unspecified organism, unspecified whether acute organ dysfunction present (HCC) [A41.9]  Procedure(s): CYSTOSCOPY WITH RETROGRADE PYELOGRAM/URETERAL STENT PLACEMENT  1 Day Post-Op   Intv/Subj: No acute events overnight. Patient is with minimal complaint.  She does have some pain with urination consistent with stent colic/irritation.  Intermittent gross hematuria as well.  She also has some urinary frequency and urgency.  She has been afebrile with vital signs stable.  Urine culture and blood cultures pending.  Active Problems:   Sepsis secondary to UTI [facility])  Current Facility-Administered Medications  Medication Dose Route Frequency Provider Last Rate Last Dose  . 0.9 %  sodium chloride infusion   Intravenous Continuous Clydia Llano, MD 150 mL/hr at 09/20/19 0528    . acetaminophen (TYLENOL) tablet 650 mg  650 mg Oral Q6H PRN Clydia Llano, MD   650 mg at 09/20/19 0848   Or  . acetaminophen (TYLENOL) suppository 650 mg  650 mg Rectal Q6H PRN Clydia Llano, MD      . ARIPiprazole (ABILIFY) tablet 5 mg  5 mg Oral Daily Clydia Llano, MD   5 mg at 09/20/19 0847  . aspirin tablet 325 mg  325 mg Oral Daily Clydia Llano, MD   325 mg at 09/20/19 0847  . buPROPion (WELLBUTRIN XL) 24 hr tablet 300 mg  300 mg Oral Daily Clydia Llano, MD   300 mg at 09/20/19 0849  . cefTRIAXone (ROCEPHIN) 1 g in sodium chloride 0.9 % 100 mL IVPB  1 g Intravenous Q24H Clydia Llano, MD      . clonazePAM Scarlette Calico) disintegrating tablet 0.25 mg  0.25 mg Oral QHS PRN Clydia Llano, MD   0.25 mg at 09/19/19 2245  . divalproex (DEPAKOTE ER) 24 hr tablet 500 mg  500 mg Oral Daily Clydia Llano, MD      . docusate sodium (COLACE) capsule 100 mg  100 mg Oral Daily PRN Clydia Llano, MD      . docusate sodium (COLACE) capsule 100 mg  100 mg Oral BID Clydia Llano, MD   100 mg at 09/20/19 0847   . enoxaparin (LOVENOX) injection 40 mg  40 mg Subcutaneous Q24H Clydia Llano, MD   40 mg at 09/20/19 0850  . HYDROcodone-acetaminophen (NORCO/VICODIN) 5-325 MG per tablet 1-2 tablet  1-2 tablet Oral Q4H PRN Clydia Llano, MD   1 tablet at 09/19/19 2253  . lamoTRIgine (LAMICTAL) tablet 150 mg  150 mg Oral Daily Clydia Llano, MD   150 mg at 09/20/19 0848  . morphine 2 MG/ML injection 1 mg  1 mg Intravenous Q4H PRN Kathlen Mody, MD      . multivitamin with minerals tablet 1 tablet  1 tablet Oral Daily Clydia Llano, MD   1 tablet at 09/20/19 0847  . ondansetron (ZOFRAN) tablet 4 mg  4 mg Oral Q6H PRN Clydia Llano, MD       Or  . ondansetron [facility]) injection 4 mg  4 mg Intravenous Q6H PRN Clydia Llano, MD      . phenazopyridine (PYRIDIUM) tablet 200 mg  200 mg Oral TID WC Ray Church III, MD      . tamsulosin (FLOMAX) capsule 0.4 mg  0.4 mg Oral QPC supper Clydia Llano, MD      . vitamin B-12 (CYANOCOBALAMIN) tablet 100 mcg  100 mcg Oral Daily Clydia Llano, MD   100 mcg at 09/20/19 0848  . vitamin C (ASCORBIC ACID) tablet 250 mg  250  mg Oral Daily Clydia Llano, MD   250 mg at 09/20/19 0847     Objective: Vital: Vitals:   09/19/19 2145 09/19/19 2200 09/20/19 0112 09/20/19 0515  BP: 116/74 126/77 101/68 108/71  Pulse: (!) 111 (!) 106 79 73  Resp: Temp: 100.2 F (37.9 C) 99.7 F (37.6 C) 97.7 F (36.5 C) 97.7 F (36.5 C)  TempSrc:  Oral Oral   SpO2: 94% 99% 99% 97%  Weight:  74.8 kg    Height:   (1.651 m)     I/Os: I/O last 3 completed shifts: In: 1686.3 [I.V.:1686.3] Out: 800 [Urine:800]  Physical Exam:  General: Patient is in no apparent distress Lungs: Normal respiratory effort, chest expands symmetrically. GI: The abdomen is soft and nontender without mass. Ext: lower extremities symmetric  Lab Results: Recent Labs    09/19/19 1654 09/20/19 0502  WBC 14.7* 14.7*  HGB 15.5* 13.7  HCT 45.1 42.3   Recent Labs     09/19/19 1654 09/20/19 0502  NA 136 142  K 3.9 4.4  CL 104 110  CO2 22 23  GLUCOSE 129* 151*  BUN 13 11  CREATININE 1.01* 0.91  CALCIUM 10.1 9.4   No results for input(s): LABPT, INR in the last 72 hours. No results for input(s): LABURIN in the last 72 hours. Results for orders placed or performed during the hospital encounter of 09/19/19  Blood culture (routine x 2)     Status: None (Preliminary result)   Collection Time: 09/19/19  5:12 PM   Specimen: BLOOD  Result Value Ref Range Status   Specimen Description   Final    BLOOD LEFT ANTECUBITAL Performed at [facility], [address], [place], [place] [address]    Special Requests   Final    BOTTLES DRAWN AEROBIC AND ANAEROBIC Blood Culture adequate volume Performed at [facility], [address], [place], [place] [address]    Culture   Final    NO GROWTH < 12 HOURS Performed at [facility], [address] [place], [place] [address]    Report Status PENDING  Incomplete  SARS Coronavirus 2 by RT PCR (hospital order, performed in [facility] hospital lab) Nasopharyngeal Nasopharyngeal Swab     Status: None   Collection Time: 09/19/19  6:39 PM   Specimen: Nasopharyngeal Swab  Result Value Ref Range Status   SARS Coronavirus 2 NEGATIVE NEGATIVE Final    Comment: (NOTE) If result is NEGATIVE SARS-CoV-2 target nucleic acids are NOT DETECTED. The SARS-CoV-2 RNA is generally detectable in upper and lower  respiratory specimens during the acute phase of infection. The lowest  concentration of SARS-CoV-2 viral copies this assay can detect is 250  copies / mL. A negative result does not preclude SARS-CoV-2 infection  and should not be used as the sole basis for treatment or other  patient management decisions.  A negative result may occur with  improper specimen collection / handling, submission of specimen other  than nasopharyngeal swab, presence of viral mutation(s) within the   areas targeted by this assay, and inadequate number of viral copies  (<250 copies / mL). A negative result must be combined with clinical  observations, patient history, and epidemiological information. If result is POSITIVE SARS-CoV-2 target nucleic acids are DETECTED. The SARS-CoV-2 RNA is generally detectable in upper and lower  respiratory specimens dur ing the acute phase of infection.  Positive  results are indicative of active infection  with SARS-CoV-2.  Clinical  correlation with patient history and other diagnostic information is  necessary to determine patient infection status.  Positive results do  not rule out bacterial infection or co-infection with other viruses. If result is PRESUMPTIVE POSTIVE SARS-CoV-2 nucleic acids MAY BE PRESENT.   A presumptive positive result was obtained on the submitted specimen  and confirmed on repeat testing.  While 2019 novel coronavirus  (SARS-CoV-2) nucleic acids may be present in the submitted sample  additional confirmatory testing may be necessary for epidemiological  and / or clinical management purposes  to differentiate between  SARS-CoV-2 and other Sarbecovirus currently known to infect humans.  If clinically indicated additional testing with an alternate test  methodology [phone]([id number]) is advised. The SARS-CoV-2 RNA is generally  detectable in upper and lower respiratory sp ecimens during the acute  phase of infection. The expected result is Negative. Fact Sheet for Patients:  BoilerBrush.com.cyhttps://www.fda.gov/media/136312/download Fact Sheet for Healthcare Providers: https://pope.com/https://www.fda.gov/media/136313/download This test is not yet approved or cleared by the [place] FDA and has been authorized for detection and/or diagnosis of SARS-CoV-2 by FDA under an Emergency Use Authorization (EUA).  This EUA will remain in effect (meaning this test can be used) for the duration of the COVID-19 declaration under Section 564(b)(1) of the Act, 21  U.S.C. section 360bbb-3(b)(1), unless the authorization is terminated or revoked sooner. Performed at [facility], [address] [place], [place] [address]   Urine Culture     Status: None (Preliminary result)   Collection Time: 09/19/19  9:00 PM   Specimen: Urine, Cystoscope  Result Value Ref Range Status   Specimen Description CYSTOSCOPY  Final   Special Requests   Final    URINE PATIENT ON FOLLOWING ROCEPHIN Performed at [facility], [address] [place], [place] [address]    Culture PENDING  Incomplete   Report Status PENDING  Incomplete    Studies/Results: Dg Chest 2 View  Result Date: 09/19/2019 CLINICAL DATA:  Flank pain EXAM: CHEST - 2 VIEW COMPARISON:  None. FINDINGS: Lungs are clear.  No pleural effusion or pneumothorax. The heart is normal in size. Visualized osseous structures are within normal limits. IMPRESSION: Normal chest radiographs. Electronically Signed   By: Charline BillsSriyesh  Krishnan M.D.   On: 09/19/2019 18:03   Dg C-arm 1-60 Min-no Report  Result Date: 09/19/2019 Fluoroscopy was utilized by the requesting physician.  No radiographic interpretation.   Ct Renal Stone Study  Result Date: 09/19/2019 CLINICAL DATA:  Right flank pain and right groin pain. Hx of kidney stones. EXAM: CT ABDOMEN AND PELVIS WITHOUT CONTRAST TECHNIQUE: Multidetector CT imaging of the abdomen and pelvis was performed following the standard protocol without IV contrast. COMPARISON:  None. FINDINGS: Lower chest: No acute abnormality. Hepatobiliary: No focal liver abnormality is seen. No gallstones, gallbladder wall thickening, or biliary dilatation. Pancreas: Unremarkable. Spleen: Normal in size without focal abnormality. Adrenals/Urinary Tract: Adrenal glands are unremarkable. Multiple small left renal calculi no hydronephrosis. The right kidney is mildly enlarged with perinephric stranding. There is moderate right hydronephrosis secondary to a 1.0 cm calculus at the  right UPJ. No additional calculi in the right kidney. Urinary bladder is unremarkable. Stomach/Bowel: Stomach is within normal limits. Appendix appears normal. No evidence of bowel wall thickening, distention, or inflammatory changes. Scattered colonic diverticula without evidence of diverticulitis. Vascular/Lymphatic: No significant vascular findings are present. No enlarged abdominal or pelvic lymph nodes. Reproductive: Uterus and bilateral adnexa are unremarkable. Other: No abdominal wall hernia or abnormality.  No abdominopelvic ascites. Musculoskeletal: No acute or significant osseous findings. IMPRESSION: 1. Obstructing 1.0 cm calculus at the right UPJ with associated moderate right hydronephrosis and perinephric stranding. 2. Multiple nonobstructing left renal calculi. Electronically Signed   By: Audie Pinto M.D.   On: 09/19/2019 17:47    Assessment: Right ureteral calculus Right ureteral obstruction secondary to calculus Urinary tract infection  Procedure(s): CYSTOSCOPY WITH RETROGRADE PYELOGRAM/URETERAL STENT PLACEMENT, 1 Day Post-Op  doing well.  Plan: Continue broad-spectrum antibiotics until cultures return.  I added Pyridium for urinary discomfort.  Continue Flomax.  She will need follow-up in about a week in our clinic for repeat urine culture and planning for ureteroscopy.   Link Snuffer, MD Urology 09/20/2019, 11:29 AM

## 2019-09-20 NOTE — Progress Notes (Signed)
Subjective: Notified by pharmacy of BCID + 2/4 [place]. BCID panel not obtained due to shortage of supplies. Currently patient being treated with Ceftriaxone for sepsis secondary to pyelonephritis and infected obstructive nephrolithiasis.  Objective: Cultures reviewed as above. Most recent vs temp 99.4 with blood pressure 139/69/114 mm Hg and pulse rate 85beats/min.    Assessment:Kristin [name] is a 53 y.o. female with medical history significant of nephrolithiasis, depression, anxiety, DVT and GERD who presents with fevers and flank pain with sepsis secondary to pyelonephritis in the setting of an obstructed kidney stone.  Plan: 1. Sepsis 2/2 pyelonephritis and infected obstructive nephrolithiasis - Broaden coverage with Vancomycin - Continue Ceftrixone pending final culture results - Consider ID consult as appropriate - Urology following - Continue medical management per treatment team   Rufina Falco, DNP, CCRN, FNP-C [facility] Nurse Practitioner Between 7pm to [place] - Pager [phone] Actively using [employer]iku secure chat messaging  After 7am go to [email or web address] - [email or web address] select [facility]  [facility] [employer]  [phone]

## 2019-09-21 LAB — CBC
HCT: 37.7 % (ref 36.0–46.0)
Hemoglobin: 12.4 g/dL (ref 12.0–15.0)
MCH: 32 pg (ref 26.0–34.0)
MCHC: 32.9 g/dL (ref 30.0–36.0)
MCV: 97.4 fL (ref 80.0–100.0)
Platelets: 172 10*3/uL (ref 150–400)
RBC: 3.87 MIL/uL (ref 3.87–5.11)
RDW: 12.7 % (ref 11.5–15.5)
WBC: 9.8 10*3/uL (ref 4.0–10.5)
nRBC: 0 % (ref 0.0–0.2)

## 2019-09-21 LAB — BLOOD CULTURE ID PANEL (REFLEXED)

## 2019-09-21 LAB — BASIC METABOLIC PANEL
Anion gap: 8 (ref 5–15)
BUN: 9 mg/dL (ref 6–20)
CO2: 23 mmol/L (ref 22–32)
Calcium: 9.1 mg/dL (ref 8.9–10.3)
Chloride: 110 mmol/L (ref 98–111)
Creatinine, Ser: 0.8 mg/dL (ref 0.44–1.00)
GFR calc Af Amer: >60 mL/min (ref >60)
GFR calc non Af Amer: >60 mL/min (ref >60)
Glucose, Bld: 109 mg/dL — ABNORMAL HIGH (ref 70–99)
Potassium: 4 mmol/L (ref 3.5–5.1)
Sodium: 141 mmol/L (ref 135–145)

## 2019-09-21 MED ORDER — MIRABEGRON ER 25 MG PO TB24
25.0000 mg | ORAL_TABLET | Freq: Every day | ORAL | Status: DC
  Administered 2019-09-21 – 2019-09-23 (×3): 25 mg via ORAL
  Filled 2019-09-21 (×3): qty 1

## 2019-09-21 MED ORDER — HYDROMORPHONE HCL 1 MG/ML IJ SOLN: 1.0000 mg | INTRAMUSCULAR | Status: DC | PRN

## 2019-09-21 MED ORDER — SODIUM CHLORIDE 0.9 % IV SOLN
3.0000 g | Freq: Four times a day (QID) | INTRAVENOUS | Status: DC
  Administered 2019-09-21 – 2019-09-23 (×8): 3 g via INTRAVENOUS
  Filled 2019-09-21 (×4): qty 3
  Filled 2019-09-21: qty 8
  Filled 2019-09-21 (×2): qty 3
  Filled 2019-09-21: qty 8
  Filled 2019-09-21: qty 3

## 2019-09-21 MED ORDER — OXYCODONE-ACETAMINOPHEN 5-325 MG PO TABS
1.0000 | ORAL_TABLET | ORAL | Status: DC | PRN
  Administered 2019-09-21: 2 via ORAL
  Administered 2019-09-21: 1 via ORAL
  Administered 2019-09-22: 2 via ORAL
  Filled 2019-09-21 (×2): qty 2
  Filled 2019-09-21: qty 1

## 2019-09-21 MED ORDER — ARIPIPRAZOLE 5 MG PO TABS
5.0000 mg | ORAL_TABLET | Freq: Every day | ORAL | Status: DC
  Administered 2019-09-21 – 2019-09-22 (×2): 5 mg via ORAL
  Filled 2019-09-21 (×3): qty 1

## 2019-09-21 MED ORDER — LAMOTRIGINE 25 MG PO TABS
150.0000 mg | ORAL_TABLET | Freq: Every day | ORAL | Status: DC
  Administered 2019-09-21 – 2019-09-22 (×2): 150 mg via ORAL
  Filled 2019-09-21 (×2): qty 2

## 2019-09-21 MED ORDER — CLONAZEPAM 1 MG PO TABS
2.0000 mg | ORAL_TABLET | Freq: Every day | ORAL | Status: DC
  Administered 2019-09-21 – 2019-09-22 (×2): 2 mg via ORAL
  Filled 2019-09-21 (×2): qty 2

## 2019-09-21 NOTE — Progress Notes (Signed)
Pharmacy Antibiotic Note  Mattelyn Imhoff is a 53 y.o. female admitted on 09/19/2019 with bacteremia.  Pharmacy has been consulted for Unasyn dosing.  Plan: Unasyn 3g IV q6 Will sign off  Height: 5\' 5"  (165.1 cm) Weight: 164 lb 14.4 oz (74.8 kg) IBW/kg (Calculated) : 57  Temp (24hrs), Avg:99.9 F (37.7 C), Min:99.3 F (37.4 C), Max:101.6 F (38.7 C)  Recent Labs  Lab 09/19/19 1654 09/19/19 1712 09/19/19 1932 09/20/19 0502 09/21/19 0436  WBC 14.7*  --   --  14.7* 9.8  CREATININE 1.01*  --   --  0.91 0.80  LATICACIDVEN  --  1.4 0.9  --   --     Estimated Creatinine Clearance: 83.2 mL/min (by C-G formula based on SCr of 0.8 mg/dL).    No Known Allergies   Thank you for allowing pharmacy to be a part of this patient's care.  Kara Mead 09/21/2019 7:32 PM

## 2019-09-21 NOTE — Progress Notes (Signed)
PHARMACY - PHYSICIAN COMMUNICATION CRITICAL VALUE ALERT - BLOOD CULTURE IDENTIFICATION (BCID)  Kristin [name] is an 53 y.o. female who presented to [facility] on 09/19/2019 with a chief complaint of urosepsis  Assessment:  Coag negative staph bacteremia (less commonly related to UTI but possibility exists (i.e. staph saprophyticus)  Name of physician (or Provider) ContactedKarleen Hampshire  Current antibiotics: Vancomycin, Rocephin  Changes to prescribed antibiotics recommended: stop vancomycin Recommendations accepted by provider  Results for orders placed or performed during the hospital encounter of 09/19/19  Blood Culture ID Panel (Reflexed) (Collected: 09/19/2019  7:32 PM)  Result Value Ref Range   Enterococcus species NOT DETECTED NOT DETECTED   Listeria monocytogenes NOT DETECTED NOT DETECTED   Staphylococcus species DETECTED (A) NOT DETECTED   Staphylococcus aureus (BCID) NOT DETECTED NOT DETECTED   Methicillin resistance NOT DETECTED NOT DETECTED   Streptococcus species NOT DETECTED NOT DETECTED   Streptococcus agalactiae NOT DETECTED NOT DETECTED   Streptococcus pneumoniae NOT DETECTED NOT DETECTED   Streptococcus pyogenes NOT DETECTED NOT DETECTED   Acinetobacter baumannii NOT DETECTED NOT DETECTED   Enterobacteriaceae species NOT DETECTED NOT DETECTED   Enterobacter cloacae complex NOT DETECTED NOT DETECTED   Escherichia coli NOT DETECTED NOT DETECTED   Klebsiella oxytoca NOT DETECTED NOT DETECTED   Klebsiella pneumoniae NOT DETECTED NOT DETECTED   Proteus species NOT DETECTED NOT DETECTED   Serratia marcescens NOT DETECTED NOT DETECTED   Haemophilus influenzae NOT DETECTED NOT DETECTED   Neisseria meningitidis NOT DETECTED NOT DETECTED   Pseudomonas aeruginosa NOT DETECTED NOT DETECTED   Candida albicans NOT DETECTED NOT DETECTED   Candida glabrata NOT DETECTED NOT DETECTED   Candida krusei NOT DETECTED NOT DETECTED   Candida parapsilosis NOT DETECTED NOT DETECTED   Candida  tropicalis NOT DETECTED NOT DETECTED    [name] A 09/21/2019  12:58 PM

## 2019-09-21 NOTE — Progress Notes (Signed)
Urology Progress Note   2 Days Post-Op from left ureteral stent placement for infected obstructing ureteral stone.   Subjective: NAEON.  Febrile to 101.6 at 4am with tachycardia. WBC 9.8 Cr 0.8 Continues to have bladder discomfort with stent in place. She would like to try myrbetriq.  Urine culture pending.  Remains on broad spectrum antibiotics.  Objective: Vital signs in last 24 hours: Temp:  [99.4 F (37.4 C)-101.6 F (38.7 C)] 99.5 F (37.5 C) (11/09 0848) Pulse Rate:  [92-113] 100 (11/09 0848) Resp:  [16-18] 18 (11/09 0848) BP: (134-152)/(69-95) 134/80 (11/09 0848) SpO2:  [92 %-94 %] 93 % (11/09 0848)  Intake/Output from previous day: 11/08 0701 - 11/09 0700 In: 3293.1 [I.V.:2593.1; IV Piggyback:700] Out: -  Intake/Output this shift: No intake/output data recorded.  Physical Exam:  General: Alert and oriented CV: Regular rate Lungs: No increased work of breathing Abdomen: Soft, appropriately tender. GU: Voiding spontaneously  Lab Results: Recent Labs    09/19/19 1654 09/20/19 0502 09/21/19 0436  HGB 15.5* 13.7 12.4  HCT 45.1 42.3 37.7   Recent Labs    09/20/19 0502 09/21/19 0436  NA 142 141  K 4.4 4.0  CL 110 110  CO2 23 23  GLUCOSE 151* 109*  BUN 11 9  CREATININE 0.91 0.80  CALCIUM 9.4 9.1    Studies/Results: Dg Chest 2 View  Result Date: 09/19/2019 CLINICAL DATA:  Flank pain EXAM: CHEST - 2 VIEW COMPARISON:  None. FINDINGS: Lungs are clear.  No pleural effusion or pneumothorax. The heart is normal in size. Visualized osseous structures are within normal limits. IMPRESSION: Normal chest radiographs. Electronically Signed   By: Charline Bills M.D.   On: 09/19/2019 18:03   Dg C-arm 1-60 Min-no Report  Result Date: 09/19/2019 Fluoroscopy was utilized by the requesting physician.  No radiographic interpretation.   Ct Renal Stone Study  Result Date: 09/19/2019 CLINICAL DATA:  Right flank pain and right groin pain. Hx of kidney stones. EXAM:  CT ABDOMEN AND PELVIS WITHOUT CONTRAST TECHNIQUE: Multidetector CT imaging of the abdomen and pelvis was performed following the standard protocol without IV contrast. COMPARISON:  None. FINDINGS: Lower chest: No acute abnormality. Hepatobiliary: No focal liver abnormality is seen. No gallstones, gallbladder wall thickening, or biliary dilatation. Pancreas: Unremarkable. Spleen: Normal in size without focal abnormality. Adrenals/Urinary Tract: Adrenal glands are unremarkable. Multiple small left renal calculi no hydronephrosis. The right kidney is mildly enlarged with perinephric stranding. There is moderate right hydronephrosis secondary to a 1.0 cm calculus at the right UPJ. No additional calculi in the right kidney. Urinary bladder is unremarkable. Stomach/Bowel: Stomach is within normal limits. Appendix appears normal. No evidence of bowel wall thickening, distention, or inflammatory changes. Scattered colonic diverticula without evidence of diverticulitis. Vascular/Lymphatic: No significant vascular findings are present. No enlarged abdominal or pelvic lymph nodes. Reproductive: Uterus and bilateral adnexa are unremarkable. Other: No abdominal wall hernia or abnormality. No abdominopelvic ascites. Musculoskeletal: No acute or significant osseous findings. IMPRESSION: 1. Obstructing 1.0 cm calculus at the right UPJ with associated moderate right hydronephrosis and perinephric stranding. 2. Multiple nonobstructing left renal calculi. Electronically Signed   By: Emmaline Kluver M.D.   On: 09/19/2019 17:47    Assessment/Plan:  53 y.o. female s/p left ureteral stent placement for infected obstructed ureteral ston.  Overall doing well post-op.   Continues to have intermittent fevers: either pyelonephritis fevers vs organism is resistant to current antibiotic regimen.    -Continue broad-spectrum antibiotics until cultures return.   - Pyridium  for urinary discomfort.   - Flomax for stent discomfort.  -  Added Myrbetriq today for bladder discomfort. -  She will need follow-up in about a week in our clinic for repeat urine culture and planning for ureteroscopy.   LOS: 2 days

## 2019-09-21 NOTE — Progress Notes (Signed)
PROGRESS NOTE    Kristin [name]  WUX:[id number] DOB: 1966/03/24 DOA: 09/19/2019 PCP: Bayard Beaver, PA-C   Brief Narrative:  53 year old lady with prior history of depression, anxiety, DVT, GERD, long history of nephrolithiasis presents to ED with complaints of right-sided flank pain associated with some nausea hematuria, dysuria associated with fevers and chills.  CT scan shows 1 cm stone at right UPJ with moderate right-sided hydronephrosis and perinephric stranding.  She also has nonobstructing left renal calculi.  Urology consulted and she underwent cystoscopy with retrograde pyelogram and ureteral stent placement by Dr. Gloriann Loan on 09/19/2019.  She was started on Rocephin and urine cultures were sent. 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  Repeat blood cultures were ordered. Assessment & Plan:   Active Problems:   Sepsis secondary to UTI ([place])   Sepsis secondary to pyelonephritis and infected obstructive nephrolithiasis associated with moderate right-sided hydronephrosis. S/p ureteral stent placement on the right side.  She was started on IV Rocephin and urine and blood cultures were obtained.    She continues to have persistent pain and had fever overnight.  Her blood cultures revealed revealed gram-positive cocci in both aerobic and anaerobic bottles and BC ID was positive for coag negative staph.  Vancomycin was added but later on discontinued.  Recommend to continue with IV Rocephin and repeat blood cultures ordered. Leukocytosis has resolved Lactate has normalized. Continue with Flomax, Pyridium at this time. Urology recommended outpatient follow-up in 1 week and a repeat urine culture.  Anxiety and depression  continue with bupropion, lamotrigine, Depakote, as needed Klonopin.   History of DVT Not on anticoagulation at this time   GERD Stable.   DVT  prophylaxis: (Lovenox Code Status: Full code Family Communication: None at bedside Disposition Plan: Pending urinary culture report and clinical improvement and resolution of pain and improvement in pyelonephritis  Consultants:   Urology Dr. Gloriann Loan  Procedures: Cystoscopy with retrograde pyelogram/ureteral stent placement by Dr. Gloriann Loan on 09/19/2019. Antimicrobials: IV Rocephin.  Subjective: Patient continues to have discomfort in her suprapubic area and in the right side of the flank.  Pain is not well controlled.  Changed IV morphine to IV Dilaudid.  Objective: Vitals:   09/20/19 2035 09/21/19 0419 09/21/19 0613 09/21/19 0848  BP: (!) 152/95 (!) 144/91 134/73 134/80  Pulse: (!) 103 (!) 110 (!) 113 100  Resp: 16 18 18 18   Temp: 100 F (37.8 C) (!) 101.6 F (38.7 C) 99.6 F (37.6 C) 99.5 F (37.5 C)  TempSrc: Oral Oral Oral Oral  SpO2: 94% 94% 92% 93%  Weight:      Height:        Intake/Output Summary (Last 24 hours) at 09/21/2019 1239 Last data filed at 09/21/2019 2536 Gross per 24 hour  Intake 3293.12 ml  Output --  Net 3293.12 ml   Filed Weights   09/19/19 2200  Weight: 74.8 kg    Examination:  General exam: Patient is in mild distress from flank pain Respiratory system: Clear to auscultation bilaterally, no wheezing or rhonchi Cardiovascular system: S1-S2 heard, regular rate rhythm, no JVD Gastrointestinal system: Abdomen is soft, suprapubic tenderness present, bowel sounds are normal, right-sided flank pain present Central nervous system: Alert and oriented, grossly nonfocal Extremities: No pedal edema, cyanosis or clubbing Skin: No rashes seen Psychiatry: Mood is appropriate    Data Reviewed: I have personally reviewed following labs and imaging studies  CBC: Recent Labs  Lab 09/19/19 1654 09/20/19 0502 09/21/19 0436  WBC 14.7* 14.7* 9.8  HGB 15.5* 13.7 12.4  HCT 45.1 42.3 37.7  MCV 93.6 98.1 97.4  PLT 248 153 172   Basic Metabolic Panel: Recent  Labs  Lab 09/19/19 1654 09/20/19 0502 09/21/19 0436  NA 136 142 141  K 3.9 4.4 4.0  CL 104 110 110  CO2 22 23 23   GLUCOSE 129* 151* 109*  BUN 13 11 9   CREATININE 1.01* 0.91 0.80  CALCIUM 10.1 9.4 9.1   GFR: Estimated Creatinine Clearance: 83.2 mL/min (by C-G formula based on SCr of 0.8 mg/dL). Liver Function Tests: Recent Labs  Lab 09/19/19 1932  AST 40  ALT 35  ALKPHOS 85  BILITOT 0.6  PROT 6.1*  ALBUMIN 3.4*   No results for input(s): LIPASE, AMYLASE in the last 168 hours. No results for input(s): AMMONIA in the last 168 hours. Coagulation Profile: No results for input(s): INR, PROTIME in the last 168 hours. Cardiac Enzymes: No results for input(s): CKTOTAL, CKMB, CKMBINDEX, TROPONINI in the last 168 hours. BNP (last 3 results) No results for input(s): PROBNP in the last 8760 hours. HbA1C: No results for input(s): HGBA1C in the last 72 hours. CBG: No results for input(s): GLUCAP in the last 168 hours. Lipid Profile: No results for input(s): CHOL, HDL, LDLCALC, TRIG, CHOLHDL, LDLDIRECT in the last 72 hours. Thyroid Function Tests: No results for input(s): TSH, T4TOTAL, FREET4, T3FREE, THYROIDAB in the last 72 hours. Anemia Panel: No results for input(s): VITAMINB12, FOLATE, FERRITIN, TIBC, IRON, RETICCTPCT in the last 72 hours. Sepsis Labs: Recent Labs  Lab 09/19/19 1712 09/19/19 1932  LATICACIDVEN 1.4 0.9    Recent Results (from the past 240 hour(s))  Blood culture (routine x 2)     Status: None (Preliminary result)   Collection Time: 09/19/19  5:12 PM   Specimen: BLOOD  Result Value Ref Range Status   Specimen Description   Final    BLOOD LEFT ANTECUBITAL Performed at [facility], [address] [place], [place]    Special Requests   Final    BOTTLES DRAWN AEROBIC AND ANAEROBIC Blood Culture adequate volume Performed at [facility], [address] [place], [place]    Culture  Setup Time    Final    GRAM POSITIVE COCCI IN BOTH AEROBIC AND ANAEROBIC BOTTLES CRITICAL RESULT CALLED TO, READ BACK BY AND VERIFIED WITH: [place] [address] AT 1451 BY CM Performed at [facility], [address], [place], [address] [place]    Culture GRAM POSITIVE COCCI  Final   Report Status PENDING  Incomplete  Urine culture     Status: None (Preliminary result)   Collection Time: 09/19/19  5:16 PM   Specimen: Urine, Random  Result Value Ref Range Status   Specimen Description   Final    URINE, RANDOM Performed at [facility], [address], [place], [place]    Special Requests   Final    NONE Performed at [facility], [address], [place], [place]    Culture   Final    CULTURE REINCUBATED FOR BETTER GROWTH Performed at [facility], [address] [place], [address] [place]    Report Status PENDING  Incomplete  SARS Coronavirus 2 by RT PCR (hospital order, performed in [facility] hospital lab) Nasopharyngeal Nasopharyngeal Swab     Status: None   Collection Time: 09/19/19  6:39 PM   Specimen: Nasopharyngeal Swab  Result Value Ref Range Status   SARS Coronavirus 2 NEGATIVE NEGATIVE Final    Comment: (NOTE) If result is NEGATIVE SARS-CoV-2 target nucleic acids are NOT DETECTED. The SARS-CoV-2 RNA is generally detectable in upper and lower  respiratory specimens during the acute phase of infection. The lowest  concentration of SARS-CoV-2 viral copies this assay can detect is 250  copies / mL. A negative result does not preclude SARS-CoV-2 infection  and should not be used as the sole basis for treatment or other  patient management decisions.  A negative result may occur with  improper specimen collection / handling, submission of specimen other  than nasopharyngeal swab, presence of viral mutation(s) within the  areas targeted by this assay, and inadequate number of viral copies  (<250 copies / mL). A  negative result must be combined with clinical  observations, patient history, and epidemiological information. If result is POSITIVE SARS-CoV-2 target nucleic acids are DETECTED. The SARS-CoV-2 RNA is generally detectable in upper and lower  respiratory specimens dur ing the acute phase of infection.  Positive  results are indicative of active infection with SARS-CoV-2.  Clinical  correlation with patient history and other diagnostic information is  necessary to determine patient infection status.  Positive results do  not rule out bacterial infection or co-infection with other viruses. If result is PRESUMPTIVE POSTIVE SARS-CoV-2 nucleic acids MAY BE PRESENT.   A presumptive positive result was obtained on the submitted specimen  and confirmed on repeat testing.  While 2019 novel coronavirus  (SARS-CoV-2) nucleic acids may be present in the submitted sample  additional confirmatory testing may be necessary for epidemiological  and / or clinical management purposes  to differentiate between  SARS-CoV-2 and other Sarbecovirus currently known to infect humans.  If clinically indicated additional testing with an alternate test  methodology [phone]) is advised. The SARS-CoV-2 RNA is generally  detectable in upper and lower respiratory sp ecimens during the acute  phase of infection. The expected result is Negative. Fact Sheet for Patients:  BoilerBrush.com.cy Fact Sheet for Healthcare Providers: https://pope.com/ This test is not yet approved or cleared by the [place] FDA and has been authorized for detection and/or diagnosis of SARS-CoV-2 by FDA under an Emergency Use Authorization (EUA).  This EUA will remain in effect (meaning this test can be used) for the duration of the COVID-19 declaration under Section 564(b)(1) of the Act, 21 U.S.C. section 360bbb-3(b)(1), unless the authorization is terminated or revoked sooner. Performed  at [facility], [address] [place], [place] [address]   Blood culture (routine x 2)     Status: None (Preliminary result)   Collection Time: 09/19/19  7:32 PM   Specimen: BLOOD  Result Value Ref Range Status   Specimen Description   Final    BLOOD RIGHT ANTECUBITAL Performed at [facility], [address] [place], [place] [address]    Special Requests   Final    BOTTLES DRAWN AEROBIC ONLY Blood Culture results may not be optimal due to an inadequate volume of blood received in culture bottles Performed at [facility], [address] [place], [place] [address]    Culture  Setup Time   Final    GRAM POSITIVE COCCI IN CLUSTERS AEROBIC BOTTLE ONLY CRITICAL RESULT CALLED TO, READ BACK BY AND VERIFIED WITH: PHARND M RENZ [id number] AT 658 AM BY CM Performed at [facility], [address] [place], [place] [address]    Culture GRAM POSITIVE COCCI  Final   Report Status PENDING  Incomplete  Blood Culture ID Panel (Reflexed)     Status: Abnormal   Collection Time: 09/19/19  7:32 PM  Result Value Ref Range Status   Enterococcus species NOT DETECTED NOT DETECTED Final   Listeria monocytogenes NOT DETECTED NOT DETECTED Final   Staphylococcus species DETECTED (A) NOT DETECTED Final    Comment: Methicillin (oxacillin) susceptible coagulase negative staphylococcus. Possible blood culture contaminant (unless isolated from more than one blood culture draw or clinical case suggests pathogenicity). No antibiotic treatment is indicated for blood  culture contaminants. CRITICAL RESULT CALLED TO, READ BACK BY AND VERIFIED WITH: PHARMD M RENZ [id number] AT 658 AM BY CM    Staphylococcus aureus (BCID) NOT DETECTED NOT DETECTED Final   Methicillin resistance NOT DETECTED NOT DETECTED Final   Streptococcus species NOT DETECTED NOT DETECTED Final   Streptococcus agalactiae NOT DETECTED NOT DETECTED Final   Streptococcus pneumoniae NOT  DETECTED NOT DETECTED Final   Streptococcus pyogenes NOT DETECTED NOT DETECTED Final   Acinetobacter baumannii NOT DETECTED NOT DETECTED Final   Enterobacteriaceae species NOT DETECTED NOT DETECTED Final   Enterobacter cloacae complex NOT DETECTED NOT DETECTED Final   Escherichia coli NOT DETECTED NOT DETECTED Final   Klebsiella oxytoca NOT DETECTED NOT DETECTED Final   Klebsiella pneumoniae NOT DETECTED NOT DETECTED Final   Proteus species NOT DETECTED NOT DETECTED Final   Serratia marcescens NOT DETECTED NOT DETECTED Final   Haemophilus influenzae NOT DETECTED NOT DETECTED Final   Neisseria meningitidis NOT DETECTED NOT DETECTED Final   Pseudomonas aeruginosa NOT DETECTED NOT DETECTED Final   Candida albicans NOT DETECTED NOT DETECTED Final   Candida glabrata NOT DETECTED NOT DETECTED Final   Candida krusei NOT DETECTED NOT DETECTED Final   Candida parapsilosis NOT DETECTED NOT DETECTED Final   Candida tropicalis NOT DETECTED NOT DETECTED Final    Comment: Performed at [facility], [address], [place], [place] [address]  Urine Culture     Status: None (Preliminary result)   Collection Time: 09/19/19  9:00 PM   Specimen: Urine, Cystoscope  Result Value Ref Range Status   Specimen Description CYSTOSCOPY  Final   Special Requests URINE PATIENT ON FOLLOWING ROCEPHIN  Final   Culture   Final    CULTURE REINCUBATED FOR BETTER GROWTH Performed at [facility], [address], [place], [place] [address]    Report Status PENDING  Incomplete  Urine culture     Status: None (Preliminary result)   Collection Time: 09/20/19  1:07 AM   Specimen: Urine, Random  Result Value Ref Range Status   Specimen Description   Final    URINE, RANDOM Performed at [facility], [address], [place], [place] [address]    Special Requests   Final    NONE Performed at [facility], [address], [place], [place] [address]    Culture   Final     CULTURE REINCUBATED FOR BETTER GROWTH Performed at [facility], [address] [place], [place] [address]    Report Status PENDING  Incomplete         Radiology Studies: Dg Chest 2 View  Result Date: 09/19/2019 CLINICAL DATA:  Flank pain EXAM: CHEST - 2 VIEW COMPARISON:  None. FINDINGS: Lungs are clear.  No pleural effusion or pneumothorax. The heart is normal in size. Visualized osseous structures are  within normal limits. IMPRESSION: Normal chest radiographs. Electronically Signed   By: Charline BillsSriyesh  Krishnan M.D.   On: 09/19/2019 18:03   Dg C-arm 1-60 Min-no Report  Result Date: 09/19/2019 Fluoroscopy was utilized by the requesting physician.  No radiographic interpretation.   Ct Renal Stone Study  Result Date: 09/19/2019 CLINICAL DATA:  Right flank pain and right groin pain. Hx of kidney stones. EXAM: CT ABDOMEN AND PELVIS WITHOUT CONTRAST TECHNIQUE: Multidetector CT imaging of the abdomen and pelvis was performed following the standard protocol without IV contrast. COMPARISON:  None. FINDINGS: Lower chest: No acute abnormality. Hepatobiliary: No focal liver abnormality is seen. No gallstones, gallbladder wall thickening, or biliary dilatation. Pancreas: Unremarkable. Spleen: Normal in size without focal abnormality. Adrenals/Urinary Tract: Adrenal glands are unremarkable. Multiple small left renal calculi no hydronephrosis. The right kidney is mildly enlarged with perinephric stranding. There is moderate right hydronephrosis secondary to a 1.0 cm calculus at the right UPJ. No additional calculi in the right kidney. Urinary bladder is unremarkable. Stomach/Bowel: Stomach is within normal limits. Appendix appears normal. No evidence of bowel wall thickening, distention, or inflammatory changes. Scattered colonic diverticula without evidence of diverticulitis. Vascular/Lymphatic: No significant vascular findings are present. No enlarged abdominal or pelvic lymph nodes. Reproductive: Uterus  and bilateral adnexa are unremarkable. Other: No abdominal wall hernia or abnormality. No abdominopelvic ascites. Musculoskeletal: No acute or significant osseous findings. IMPRESSION: 1. Obstructing 1.0 cm calculus at the right UPJ with associated moderate right hydronephrosis and perinephric stranding. 2. Multiple nonobstructing left renal calculi. Electronically Signed   By: Emmaline KluverNancy  Ballantyne M.D.   On: 09/19/2019 17:47        Scheduled Meds:  ARIPiprazole  5 mg Oral QHS   aspirin  325 mg Oral Daily   buPROPion  300 mg Oral Daily   divalproex  500 mg Oral Daily   docusate sodium  100 mg Oral BID   enoxaparin (LOVENOX) injection  40 mg Subcutaneous Q24H   lamoTRIgine  150 mg Oral QHS   mirabegron ER  25 mg Oral Daily   multivitamin with minerals  1 tablet Oral Daily   phenazopyridine  200 mg Oral TID WC   tamsulosin  0.4 mg Oral QPC supper   vitamin B-12  100 mcg Oral Daily   vitamin C  250 mg Oral Daily   Continuous Infusions:  sodium chloride 150 mL/hr at 09/21/19 1043   cefTRIAXone (ROCEPHIN)  IV       LOS: 2 days        Kathlen ModyVijaya Ayomide Zuleta, MD [facility]

## 2019-09-22 ENCOUNTER — Inpatient Hospital Stay (HOSPITAL_COMMUNITY): Payer: Managed Care, Other (non HMO)

## 2019-09-22 DIAGNOSIS — R7881 Bacteremia: Secondary | ICD-10-CM

## 2019-09-22 LAB — BASIC METABOLIC PANEL
Anion gap: 9 (ref 5–15)
BUN: <5 mg/dL — ABNORMAL LOW (ref 6–20)
CO2: 25 mmol/L (ref 22–32)
Calcium: 9.1 mg/dL (ref 8.9–10.3)
Chloride: 107 mmol/L (ref 98–111)
Creatinine, Ser: 0.63 mg/dL (ref 0.44–1.00)
GFR calc Af Amer: >60 mL/min (ref >60)
GFR calc non Af Amer: >60 mL/min (ref >60)
Glucose, Bld: 114 mg/dL — ABNORMAL HIGH (ref 70–99)
Potassium: 3.3 mmol/L — ABNORMAL LOW (ref 3.5–5.1)
Sodium: 141 mmol/L (ref 135–145)

## 2019-09-22 LAB — CULTURE, BLOOD (ROUTINE X 2): Special Requests: ADEQUATE

## 2019-09-22 LAB — CBC
HCT: 34.9 % — ABNORMAL LOW (ref 36.0–46.0)
Hemoglobin: 11.2 g/dL — ABNORMAL LOW (ref 12.0–15.0)
MCH: 31.4 pg (ref 26.0–34.0)
MCHC: 32.1 g/dL (ref 30.0–36.0)
MCV: 97.8 fL (ref 80.0–100.0)
Platelets: 189 10*3/uL (ref 150–400)
RBC: 3.57 MIL/uL — ABNORMAL LOW (ref 3.87–5.11)
RDW: 12.6 % (ref 11.5–15.5)
WBC: 8.2 10*3/uL (ref 4.0–10.5)
nRBC: 0 % (ref 0.0–0.2)

## 2019-09-22 LAB — URINE CULTURE: Culture: >=100000 — AB

## 2019-09-22 LAB — ECHOCARDIOGRAM COMPLETE
Height: 65 in
Weight: 2638.4 oz

## 2019-09-22 NOTE — Plan of Care (Signed)
Pt verbalizes POC for this evening.

## 2019-09-22 NOTE — Progress Notes (Signed)
  Echocardiogram 2D Echocardiogram has been performed.  Kristin [name] 09/22/2019, 12:45 PM

## 2019-09-22 NOTE — Progress Notes (Signed)
Urology Progress Note   3 Days Post-Op from left ureteral stent placement for infected obstructing ureteral stone; positive blood cultures with Staph Epi.   Subjective: NAEON.  Febrile to 101.2 overnight with tachycardia.  No leukocytosis Creatinine at baseline.  Bladder discomfort improved. Hematuria resolved.  Blood cultures with GPC->Staph epi.  Antbiotic narrowed to Unasyn.   Objective: Vital signs in last 24 hours: Temp:  [99.2 F (37.3 C)-101.2 F (38.4 C)] 99.4 F (37.4 C) (11/10 1038) Pulse Rate:  [86-108] 98 (11/10 0556) Resp:  [16-20] 16 (11/10 0556) BP: (104-152)/(57-92) 104/69 (11/10 0556) SpO2:  [91 %-95 %] 95 % (11/10 0556)  Intake/Output from previous day: 11/09 0701 - 11/10 0700 In: 3894.7 [P.O.:480; I.V.:3314.7; IV Piggyback:100] Out: 800 [Urine:800] Intake/Output this shift: Total I/O In: 240 [P.O.:240] Out: 0   Physical Exam:  General: Alert and oriented CV: Regular rate Lungs: No increased work of breathing Abdomen: Soft, nontender, nondistended GU: Voiding spontaneously  Lab Results: Recent Labs    09/20/19 0502 09/21/19 0436 09/22/19 0435  HGB 13.7 12.4 11.2*  HCT 42.3 37.7 34.9*   Recent Labs    09/21/19 0436 09/22/19 0435  NA 141 141  K 4.0 3.3*  CL 110 107  CO2 23 25  GLUCOSE 109* 114*  BUN 9 <5*  CREATININE 0.80 0.63  CALCIUM 9.1 9.1    Studies/Results: No results found.  Assessment/Plan:  53 y.o. female s/p left ureteral stent placement for infected obstructed ureteral stone.  Overall doing well post-op.   Urine culture grew Staph epi and Enterococcus. Blood culture grew Staph epi.  Antibiotics narrowed to Unasyn on 11/10.   Continues to have intermittent fevers: likely secondary to pyelonephritis fevers / bacteremia.   - Agree with treatment for bacteremia / complicated UTI  - Follow up urine culture sensitivities  - Pyridium for urinary discomfort.   - Flomax for stent discomfort.  - Myrbetriq today for  bladder discomfort. -  She will need follow-up in about a week in our clinic for repeat urine culture and planning for ureteroscopy.   LOS: 3 days

## 2019-09-22 NOTE — Progress Notes (Signed)
PROGRESS NOTE    Kristin [name]  WUJ:[id number] DOB: 03-08-1966 DOA: 09/19/2019 PCP: [name], PA-C   Brief Narrative:  53 year old lady with prior history of depression, anxiety, DVT, GERD, long history of nephrolithiasis presents to ED with complaints of right-sided flank pain associated with some nausea hematuria, dysuria associated with fevers and chills.  CT scan shows 1 cm stone at right UPJ with moderate right-sided hydronephrosis and perinephric stranding.  She also has nonobstructing left renal calculi.  Urology consulted and she underwent cystoscopy with retrograde pyelogram and ureteral stent placement by Dr. Alvester Morin on 09/19/2019.   her blood cultures show gram-positive cocci in both aerobic and anaerobic bottles which were found to be Staph epidermidis Urine culture shows both staph epidermidis and Enterococcus. IV Rocephin was changed to Unasyn. Discussed the above blood cultures and urine cultures with infectious disease Dr. Daiva Eves who recommended a total of 2 weeks of antibiotic therapy preferably Augmentin on discharge.   Meanwhile echocardiogram ordered to evaluate for endocarditis. Patient seen and examined today she appears to be improving her bladder discomfort and right flank pain have improved but not completely resolved she is hopeful to be discharged tomorrow.    Assessment & Plan:   Active Problems:   Sepsis secondary to UTI (HCC)   Sepsis secondary to pyelonephritis and infected obstructive nephrolithiasis associated with moderate right-sided hydronephrosis. S/p ureteral stent placement on the right side.  She was started on IV Rocephin and urine and blood cultures were obtained.   Her blood cultures revealed staph epidermidis and urine cultures revealed both staph epidermidis and Enterococcus. IV Rocephin was changed to IV Unasyn. Further sensitivities of the Enterococcus are pending. Discussed the above culture report with infectious disease specialist Dr. Daiva Eves  who recommended 2 weeks of IV antibiotic therapy, preferably Augmentin on discharge Repeat blood cultures have been ordered and are pending, so far they have been negative. Her T-max has been 99.2 and Leukocytosis has resolved Lactate has normalized. Continue with Flomax, Pyridium and Myrebetriq at this time. Appreciate urology recommendations. Urology recommended outpatient follow-up in 1 week and a repeat urine culture.  Anxiety and depression  continue with bupropion, lamotrigine, Depakote, as needed Klonopin.   History of DVT Not on anticoagulation at this time   GERD Stable.   Mild normocytic anemia Continue to monitor   DVT prophylaxis: Lovenox Code Status: Full code Family Communication: None at bedside Disposition Plan: Possible discharge home tomorrow if she remains afebrile and her flank pain and bladder discomfort is resolved. Consultants:   Urology Dr. Alvester Morin  Procedures: Cystoscopy with retrograde pyelogram/ureteral stent placement by Dr. Alvester Morin on 09/19/2019. Antimicrobials: IV Unasyn from 09/21/2019.  Subjective: She denies any nausea or vomiting her right-sided flank pain and abdominal,/suprapubic discomfort has improved. She denies any chest pain, shortness of breath or palpitations  Objective: Vitals:   09/22/19 0232 09/22/19 0401 09/22/19 0556 09/22/19 1038  BP: (!) 143/89 (!) 113/57 104/69   Pulse:  (!) 108 98   Resp: Temp: (!) 101.2 F (38.4 C) 99.8 F (37.7 C) 99.2 F (37.3 C) 99.4 F (37.4 C)  TempSrc: Oral Oral Oral Oral  SpO2: 93% 93% 95%   Weight:      Height:        Intake/Output Summary (Last 24 hours) at 09/22/2019 1224 Last data filed at 09/22/2019 1000 Gross per 24 hour  Intake 4134.71 ml  Output 800 ml  Net 3334.71 ml   Filed Weights   09/19/19  2200  Weight: 74.8 kg    Examination:  General exam: Alert and comfortable, not in any kind of distress Respiratory system: Clear to auscultation bilaterally no wheezing  or rhonchi. Cardiovascular system: S1-S2 heard, regular rate rhythm, no JVD Gastrointestinal system: Abdomen is soft, nontender nondistended bowel sounds are normal Central nervous system: Alert and oriented, grossly nonfocal Extremities: Pedal edema cyanosis no pedal edema or cyanosis or clubbing Skin: No rashes seen Psychiatry: Mood is appropriate    Data Reviewed: I have personally reviewed following labs and imaging studies  CBC: Recent Labs  Lab 09/19/19 1654 09/20/19 0502 09/21/19 0436 09/22/19 0435  WBC 14.7* 14.7* 9.8 8.2  HGB 15.5* 13.7 12.4 11.2*  HCT 45.1 42.3 37.7 34.9*  MCV 93.6 98.1 97.4 97.8  PLT 248 153 172 189   Basic Metabolic Panel: Recent Labs  Lab 09/19/19 1654 09/20/19 0502 09/21/19 0436 09/22/19 0435  NA 136 142 141 141  K 3.9 4.4 4.0 3.3*  CL 104 110 110 107  CO2 22 23 23 25   GLUCOSE 129* 151* 109* 114*  BUN 13 11 9  <5*  CREATININE 1.01* 0.91 0.80 0.63  CALCIUM 10.1 9.4 9.1 9.1   GFR: Estimated Creatinine Clearance: 83.2 mL/min (by C-G formula based on SCr of 0.63 mg/dL). Liver Function Tests: Recent Labs  Lab 09/19/19 1932  AST 40  ALT 35  ALKPHOS 85  BILITOT 0.6  PROT 6.1*  ALBUMIN 3.4*   No results for input(s): LIPASE, AMYLASE in the last 168 hours. No results for input(s): AMMONIA in the last 168 hours. Coagulation Profile: No results for input(s): INR, PROTIME in the last 168 hours. Cardiac Enzymes: No results for input(s): CKTOTAL, CKMB, CKMBINDEX, TROPONINI in the last 168 hours. BNP (last 3 results) No results for input(s): PROBNP in the last 8760 hours. HbA1C: No results for input(s): HGBA1C in the last 72 hours. CBG: No results for input(s): GLUCAP in the last 168 hours. Lipid Profile: No results for input(s): CHOL, HDL, LDLCALC, TRIG, CHOLHDL, LDLDIRECT in the last 72 hours. Thyroid Function Tests: No results for input(s): TSH, T4TOTAL, FREET4, T3FREE, THYROIDAB in the last 72 hours. Anemia Panel: No results  for input(s): VITAMINB12, FOLATE, FERRITIN, TIBC, IRON, RETICCTPCT in the last 72 hours. Sepsis Labs: Recent Labs  Lab 09/19/19 1712 09/19/19 1932  LATICACIDVEN 1.4 0.9    Recent Results (from the past 240 hour(s))  Blood culture (routine x 2)     Status: Abnormal   Collection Time: 09/19/19  5:12 PM   Specimen: BLOOD  Result Value Ref Range Status   Specimen Description   Final    BLOOD LEFT ANTECUBITAL Performed at [facility], [address] [place], [place] [address]    Special Requests   Final    BOTTLES DRAWN AEROBIC AND ANAEROBIC Blood Culture adequate volume Performed at [facility], [address] [place], [place] [address]    Culture  Setup Time   Final    GRAM POSITIVE COCCI IN BOTH AEROBIC AND ANAEROBIC BOTTLES CRITICAL RESULT CALLED TO, READ BACK BY AND VERIFIED WITH: Palma HolterHARMD M SWAINE [id number] AT 1451 BY CM Performed at [facility], [address] [place], [place] [address]    Culture STAPHYLOCOCCUS EPIDERMIDIS (A)  Final   Report Status 09/22/2019 FINAL  Final   Organism ID, Bacteria STAPHYLOCOCCUS EPIDERMIDIS  Final      Susceptibility   Staphylococcus epidermidis - MIC*    CIPROFLOXACIN <=0.5 SENSITIVE Sensitive  ERYTHROMYCIN <=0.25 SENSITIVE Sensitive     GENTAMICIN <=0.5 SENSITIVE Sensitive     OXACILLIN <=0.25 SENSITIVE Sensitive     TETRACYCLINE <=1 SENSITIVE Sensitive     VANCOMYCIN 1 SENSITIVE Sensitive     TRIMETH/SULFA <=10 SENSITIVE Sensitive     CLINDAMYCIN <=0.25 SENSITIVE Sensitive     RIFAMPIN <=0.5 SENSITIVE Sensitive     Inducible Clindamycin NEGATIVE Sensitive     * STAPHYLOCOCCUS EPIDERMIDIS  Urine culture     Status: Abnormal   Collection Time: 09/19/19  5:16 PM   Specimen: Urine, Random  Result Value Ref Range Status   Specimen Description   Final    URINE, RANDOM Performed at [facility], [address], [place], [place] [address]    Special Requests   Final     NONE Performed at [facility], [address] [place], [place] [address]    Culture >=100,000 COLONIES/mL STAPHYLOCOCCUS EPIDERMIDIS (A)  Final   Report Status 09/22/2019 FINAL  Final   Organism ID, Bacteria STAPHYLOCOCCUS EPIDERMIDIS (A)  Final      Susceptibility   Staphylococcus epidermidis - MIC*    CIPROFLOXACIN <=0.5 SENSITIVE Sensitive     GENTAMICIN <=0.5 SENSITIVE Sensitive     NITROFURANTOIN <=16 SENSITIVE Sensitive     OXACILLIN <=0.25 SENSITIVE Sensitive     TETRACYCLINE <=1 SENSITIVE Sensitive     VANCOMYCIN 1 SENSITIVE Sensitive     TRIMETH/SULFA <=10 SENSITIVE Sensitive     CLINDAMYCIN <=0.25 SENSITIVE Sensitive     RIFAMPIN <=0.5 SENSITIVE Sensitive     Inducible Clindamycin NEGATIVE Sensitive     * >=100,000 COLONIES/mL STAPHYLOCOCCUS EPIDERMIDIS  SARS Coronavirus 2 by RT PCR (hospital order, performed in [facility] hospital lab) Nasopharyngeal Nasopharyngeal Swab     Status: None   Collection Time: 09/19/19  6:39 PM   Specimen: Nasopharyngeal Swab  Result Value Ref Range Status   SARS Coronavirus 2 NEGATIVE NEGATIVE Final    Comment: (NOTE) If result is NEGATIVE SARS-CoV-2 target nucleic acids are NOT DETECTED. The SARS-CoV-2 RNA is generally detectable in upper and lower  respiratory specimens during the acute phase of infection. The lowest  concentration of SARS-CoV-2 viral copies this assay can detect is 250  copies / mL. A negative result does not preclude SARS-CoV-2 infection  and should not be used as the sole basis for treatment or other  patient management decisions.  A negative result may occur with  improper specimen collection / handling, submission of specimen other  than nasopharyngeal swab, presence of viral mutation(s) within the  areas targeted by this assay, and inadequate number of viral copies  (<250 copies / mL). A negative result must be combined with clinical  observations, patient history, and epidemiological  information. If result is POSITIVE SARS-CoV-2 target nucleic acids are DETECTED. The SARS-CoV-2 RNA is generally detectable in upper and lower  respiratory specimens dur ing the acute phase of infection.  Positive  results are indicative of active infection with SARS-CoV-2.  Clinical  correlation with patient history and other diagnostic information is  necessary to determine patient infection status.  Positive results do  not rule out bacterial infection or co-infection with other viruses. If result is PRESUMPTIVE POSTIVE SARS-CoV-2 nucleic acids MAY BE PRESENT.   A presumptive positive result was obtained on the submitted specimen  and confirmed on repeat testing.  While 2019 novel coronavirus  (SARS-CoV-2) nucleic acids may be present in the submitted sample  additional confirmatory testing may be necessary  for epidemiological  and / or clinical management purposes  to differentiate between  SARS-CoV-2 and other Sarbecovirus currently known to infect humans.  If clinically indicated additional testing with an alternate test  methodology [id number]) is advised. The SARS-CoV-2 RNA is generally  detectable in upper and lower respiratory sp ecimens during the acute  phase of infection. The expected result is Negative. Fact Sheet for Patients:  BoilerBrush.com.cy Fact Sheet for Healthcare Providers: https://pope.com/ This test is not yet approved or cleared by the [place] FDA and has been authorized for detection and/or diagnosis of SARS-CoV-2 by FDA under an Emergency Use Authorization (EUA).  This EUA will remain in effect (meaning this test can be used) for the duration of the COVID-19 declaration under Section 564(b)(1) of the Act, 21 U.S.C. section 360bbb-3(b)(1), unless the authorization is terminated or revoked sooner. Performed at [facility], [address], [place], [place] [address]   Blood culture  (routine x 2)     Status: Abnormal   Collection Time: 09/19/19  7:32 PM   Specimen: BLOOD  Result Value Ref Range Status   Specimen Description   Final    BLOOD RIGHT ANTECUBITAL Performed at [facility], [address] [place], [place] [address]    Special Requests   Final    BOTTLES DRAWN AEROBIC ONLY Blood Culture results may not be optimal due to an inadequate volume of blood received in culture bottles Performed at [facility], [address] [place], [place] [address]    Culture  Setup Time   Final    GRAM POSITIVE COCCI IN CLUSTERS AEROBIC BOTTLE ONLY CRITICAL RESULT CALLED TO, READ BACK BY AND VERIFIED WITH: PHARND M RENZ [id number] AT 658 AM BY CM    Culture (A)  Final    STAPHYLOCOCCUS EPIDERMIDIS SUSCEPTIBILITIES PERFORMED ON PREVIOUS CULTURE WITHIN THE LAST 5 DAYS. Performed at [facility], [address], [place], [place] [address]    Report Status 09/22/2019 FINAL  Final  Blood Culture ID Panel (Reflexed)     Status: Abnormal   Collection Time: 09/19/19  7:32 PM  Result Value Ref Range Status   Enterococcus species NOT DETECTED NOT DETECTED Final   Listeria monocytogenes NOT DETECTED NOT DETECTED Final   Staphylococcus species DETECTED (A) NOT DETECTED Final    Comment: Methicillin (oxacillin) susceptible coagulase negative staphylococcus. Possible blood culture contaminant (unless isolated from more than one blood culture draw or clinical case suggests pathogenicity). No antibiotic treatment is indicated for blood  culture contaminants. CRITICAL RESULT CALLED TO, READ BACK BY AND VERIFIED WITH: PHARMD M RENZ [id number] AT 658 AM BY CM    Staphylococcus aureus (BCID) NOT DETECTED NOT DETECTED Final   Methicillin resistance NOT DETECTED NOT DETECTED Final   Streptococcus species NOT DETECTED NOT DETECTED Final   Streptococcus agalactiae NOT DETECTED NOT DETECTED Final   Streptococcus pneumoniae NOT DETECTED NOT DETECTED Final    Streptococcus pyogenes NOT DETECTED NOT DETECTED Final   Acinetobacter baumannii NOT DETECTED NOT DETECTED Final   Enterobacteriaceae species NOT DETECTED NOT DETECTED Final   Enterobacter cloacae complex NOT DETECTED NOT DETECTED Final   Escherichia coli NOT DETECTED NOT DETECTED Final   Klebsiella oxytoca NOT DETECTED NOT DETECTED Final   Klebsiella pneumoniae NOT DETECTED NOT DETECTED Final   Proteus species NOT DETECTED NOT DETECTED Final   Serratia marcescens NOT DETECTED NOT DETECTED Final   Haemophilus influenzae NOT DETECTED NOT DETECTED Final   Neisseria meningitidis NOT  DETECTED NOT DETECTED Final   Pseudomonas aeruginosa NOT DETECTED NOT DETECTED Final   Candida albicans NOT DETECTED NOT DETECTED Final   Candida glabrata NOT DETECTED NOT DETECTED Final   Candida krusei NOT DETECTED NOT DETECTED Final   Candida parapsilosis NOT DETECTED NOT DETECTED Final   Candida tropicalis NOT DETECTED NOT DETECTED Final    Comment: Performed at [facility], [address] [place], [place] [address]  Urine Culture     Status: Abnormal (Preliminary result)   Collection Time: 09/19/19  9:00 PM   Specimen: Urine, Cystoscope  Result Value Ref Range Status   Specimen Description CYSTOSCOPY  Final   Special Requests URINE PATIENT ON FOLLOWING ROCEPHIN  Final   Culture (A)  Final    >=100,000 COLONIES/mL STAPHYLOCOCCUS EPIDERMIDIS >=100,000 COLONIES/mL ENTEROCOCCUS FAECALIS SUSCEPTIBILITIES TO FOLLOW Performed at [facility], [address], [place], [place] [address]    Report Status PENDING  Incomplete  Urine culture     Status: None (Preliminary result)   Collection Time: 09/20/19  1:07 AM   Specimen: Urine, Random  Result Value Ref Range Status   Specimen Description   Final    URINE, RANDOM Performed at [facility], [address] [place], [place] [address]    Special Requests   Final    NONE Performed at [facility], [address] [place], [place] [address]    Culture   Final    CULTURE REINCUBATED FOR BETTER GROWTH 10,000 COLONIES/mL STAPHYLOCOCCUS EPIDERMIDIS    Report Status PENDING  Incomplete  Culture, blood (routine x 2)     Status: None (Preliminary result)   Collection Time: 09/21/19  1:18 PM   Specimen: BLOOD RIGHT HAND  Result Value Ref Range Status   Specimen Description   Final    BLOOD RIGHT HAND Performed at [facility], [address] [place], [place] [address]    Special Requests   Final    BOTTLES DRAWN AEROBIC AND ANAEROBIC Blood Culture adequate volume Performed at [facility], [address], [place], [place] [address]    Culture   Final    NO GROWTH < 24 HOURS Performed at [facility], [address], [place], [place] [address]    Report Status PENDING  Incomplete  Culture, blood (routine x 2)     Status: None (Preliminary result)   Collection Time: 09/21/19  1:24 PM   Specimen: Left Antecubital; Blood  Result Value Ref Range Status   Specimen Description   Final    LEFT ANTECUBITAL Performed at [facility], [address] [place] [address] [place], [place] [address]    Special Requests   Final    BOTTLES DRAWN AEROBIC AND ANAEROBIC Blood Culture adequate volume Performed at [facility] [address], [place], [place] [address]    Culture   Final    NO GROWTH < 24 HOURS Performed at [facility], [address] [place], [place] [address]    Report Status PENDING  Incomplete         Radiology Studies: No results found.      Scheduled Meds: . ARIPiprazole  5 mg Oral QHS  . aspirin  325 mg Oral Daily  . buPROPion  300 mg Oral Daily  . clonazePAM  2 mg Oral QHS  . divalproex  500 mg Oral Daily  . docusate sodium  100 mg Oral BID  . enoxaparin (  LOVENOX) injection  40 mg Subcutaneous Q24H  . lamoTRIgine  150 mg Oral QHS  . mirabegron ER  25 mg Oral Daily  . multivitamin with  minerals  1 tablet Oral Daily  . phenazopyridine  200 mg Oral TID WC  . tamsulosin  0.4 mg Oral QPC supper  . vitamin B-12  100 mcg Oral Daily  . vitamin C  250 mg Oral Daily   Continuous Infusions: . sodium chloride 150 mL/hr at 09/21/19 1613  . ampicillin-sulbactam (UNASYN) IV 3 g (09/22/19 0824)     LOS: 3 days        Hosie Poisson, MD [facility]

## 2019-09-23 DIAGNOSIS — N390 Urinary tract infection, site not specified: Secondary | ICD-10-CM

## 2019-09-23 LAB — URINE CULTURE
Culture: >=100000 — AB
Culture: 20000 — AB

## 2019-09-23 MED ORDER — AMOXICILLIN-POT CLAVULANATE 875-125 MG PO TABS: 1.0000 | ORAL_TABLET | Freq: Two times a day (BID) | ORAL | 0 refills | Status: AC

## 2019-09-23 MED ORDER — OXYCODONE-ACETAMINOPHEN 5-325 MG PO TABS: 1.0000 | ORAL_TABLET | Freq: Three times a day (TID) | ORAL | 0 refills | Status: AC | PRN

## 2019-09-23 MED ORDER — MIRABEGRON ER 25 MG PO TB24: 25.0000 mg | ORAL_TABLET | Freq: Every day | ORAL | 0 refills | Status: DC

## 2019-09-23 MED ORDER — TAMSULOSIN HCL 0.4 MG PO CAPS: 0.4000 mg | ORAL_CAPSULE | Freq: Every day | ORAL | 0 refills | Status: AC

## 2019-09-23 MED ORDER — PHENAZOPYRIDINE HCL 200 MG PO TABS: 200.0000 mg | ORAL_TABLET | Freq: Three times a day (TID) | ORAL | 0 refills | Status: AC

## 2019-09-23 NOTE — Discharge Instructions (Signed)

## 2019-09-23 NOTE — Progress Notes (Signed)
Pt given discharge instructions and all questions answered 

## 2019-09-23 NOTE — Discharge Summary (Signed)
Physician Discharge Summary  Kristin [name] JGG:[id number] DOB: 1965/12/31 DOA: 09/19/2019  PCP: Kristin Pilot, PA-C  Admit date: 09/19/2019 Discharge date: 09/23/2019  Admitted From: Home Discharge disposition: Home   Code Status: Full Code  Diet Recommendation: Regular diet   Recommendations for Outpatient Follow-Up:   1. Follow-up with PCP as an outpatient 2. Follow-up with urology as an outpatient  Discharge Diagnosis:   Active Problems:   Sepsis secondary to UTI [facility])  History of Present Illness / Brief narrative:  52 year old lady with prior history of depression, anxiety, DVT, GERD, long history of nephrolithiasis. She presented to the ED on 11/7 with complaints of right-sided flank pain associated with some nausea hematuria, dysuria associated with fevers and chills.   CT scan abdomen showed 1 cm stone at right UPJ with moderate right-sided hydronephrosis and perinephric stranding.  She also has nonobstructing left renal calculi.    Patient was admitted under hospitalist service for sepsis secondary to UTI.   Urology was consulted and patient underwent cystoscopy with retrograde pyelogram and ureteral stent placement by Dr. Alvester Morin on the same day 09/19/2019.    Blood cultures obtained on admission showed staph epidermidis in both aerobic and anaerobic bottles. Urine cultures also grew staph epidermidis as well as Enterococcus faecalis.  Hospital Course:  Sepsis secondary UTI/pyelonephritis Obstructive uropathy with hydronephrosis Staph epidermidis UTI and bacteremia Enterococcus faecalis UTI -CT scan findings. -patient underwent cystoscopy with retrograde pyelogram and ureteral stent placement by Dr. Alvester Morin on the same day 09/19/2019.   -Blood culture and urine culture as above. -Repeat blood culture sent on 11/9 did not show any growth. -Echocardiogram did not show any vegetation. -Previous hospitalist discussed the above culture report with infectious disease  specialist Dr. Daiva Eves who recommended 2 weeks of antibiotic therapy, preferably Augmentin on discharge.  -No fever in last 24 hours.  Lactic acid has normalized. -We will discharge the patient home today on oral Augmentin for next 12 days to complete 2 weeks of treatment since the last negative blood culture on 11/9. -Continue with Flomax, Pyridium and Myrebetriq at this time.  -Short course of Norco given at discharge.  Anxiety and depression -continue with bupropion, lamotrigine, Depakote, as needed Klonopin.  History of DVT - Not on anticoagulation at this time  GERD - Stable  Mild normocytic anemia - Continue to monitor  Stable for discharge to home today.  Work note given for next 3 days off.  Subjective:  Seen and examined this morning.  Pleasant middle-aged Caucasian female.  Sitting up in bed.  Not in distress.  Complains of persistent mild right flank pain controlled with pain medicine.  Feels ready to go home otherwise.  No fever last 24 hours.  WBC count normal.  Discharge Exam:   Vitals:   09/22/19 1852 09/22/19 2048 09/23/19 0505 09/23/19 0947  BP:  (!) 141/98 (!) 140/95 (!) 146/95  Pulse:  (!) 104 95 (!) 102  Resp:  18 16 18   Temp: 98.7 F (37.1 C) 98.7 F (37.1 C) 98.1 F (36.7 C) 99.3 F (37.4 C)  TempSrc: Oral Oral Oral Oral  SpO2:  91% 94% 95%  Weight:      Height:        Body mass index is 27.44 kg/m.  General exam: Appears calm and comfortable.  Skin: No rashes, lesions or ulcers. HEENT: Atraumatic, normocephalic, supple neck, no obvious bleeding Lungs: Clear to auscultation bilaterally CVS: Regular rate and rhythm, no murmur GI/Abd soft, mild tenderness in the right  flank, bowel sound present CNS: Alert, awake, oriented x3 Psychiatry: Mood appropriate Extremities: No pedal edema, no calf tenderness  Discharge Instructions:  Wound care: None Discharge Instructions    Increase activity slowly   Complete by: As directed      Follow-up  Information    Crista Elliot, MD Follow up.   Specialty: Urology Contact information: [address] [place] [address] [phone]        Kristin Pilot, PA-C Follow up.   Specialty: Physician Assistant Contact information: [phone] [place] [address] [phone]          Allergies as of 09/23/2019   No Known Allergies     Medication List    TAKE these medications   acetaminophen 500 MG tablet Commonly known as: TYLENOL Take 500 mg by mouth every 6 (six) hours as needed for moderate pain.   amoxicillin-clavulanate 875-125 MG tablet Commonly known as: Augmentin Take 1 tablet by mouth every 12 (twelve) hours for 12 days.   ARIPiprazole 10 MG tablet Commonly known as: ABILIFY Take 10 mg by mouth daily.   aspirin 325 MG tablet Take 325 mg by mouth daily.   buPROPion 300 MG 24 hr tablet Commonly known as: WELLBUTRIN XL Take 300 mg by mouth daily.   clonazePAM 1 MG tablet Commonly known as: KLONOPIN Take 2 mg by mouth at bedtime.   docusate sodium 100 MG capsule Commonly known as: COLACE Take 100 mg by mouth daily.   lamoTRIgine 100 MG tablet Commonly known as: LAMICTAL Take 150 mg by mouth daily.   meloxicam 15 MG tablet Commonly known as: MOBIC Take 15 mg by mouth daily as needed for pain.   mirabegron ER 25 MG Tb24 tablet Commonly known as: MYRBETRIQ Take 1 tablet (25 mg total) by mouth daily for 10 days. Start taking on: September 24, 2019   multivitamin tablet Take 1 tablet by mouth daily.   oxyCODONE-acetaminophen 5-325 MG tablet Commonly known as: PERCOCET/ROXICET Take 1-2 tablets by mouth every 8 (eight) hours as needed for up to 5 days for moderate pain or severe pain.   phenazopyridine 200 MG tablet Commonly known as: PYRIDIUM Take 1 tablet (200 mg total) by mouth 3 (three) times daily with meals for 10 days.   tamsulosin 0.4 MG Caps capsule Commonly known as: FLOMAX Take 1 capsule (0.4 mg total)  by mouth daily after supper for 10 days.   vitamin C 1000 MG tablet Take 2,000 mg by mouth daily.       Time coordinating discharge: 35 minutes  The results of significant diagnostics from this hospitalization (including imaging, microbiology, ancillary and laboratory) are listed below for reference.    Procedures and Diagnostic Studies:   Dg Chest 2 View  Result Date: 09/19/2019 CLINICAL DATA:  Flank pain EXAM: CHEST - 2 VIEW COMPARISON:  None. FINDINGS: Lungs are clear.  No pleural effusion or pneumothorax. The heart is normal in size. Visualized osseous structures are within normal limits. IMPRESSION: Normal chest radiographs. Electronically Signed   By: Charline Bills M.D.   On: 09/19/2019 18:03   Dg C-arm 1-60 Min-no Report  Result Date: 09/19/2019 Fluoroscopy was utilized by the requesting physician.  No radiographic interpretation.   Ct Renal Stone Study  Result Date: 09/19/2019 CLINICAL DATA:  Right flank pain and right groin pain. Hx of kidney stones. EXAM: CT ABDOMEN AND PELVIS WITHOUT CONTRAST TECHNIQUE: Multidetector CT imaging of the abdomen and pelvis was performed following the  standard protocol without IV contrast. COMPARISON:  None. FINDINGS: Lower chest: No acute abnormality. Hepatobiliary: No focal liver abnormality is seen. No gallstones, gallbladder wall thickening, or biliary dilatation. Pancreas: Unremarkable. Spleen: Normal in size without focal abnormality. Adrenals/Urinary Tract: Adrenal glands are unremarkable. Multiple small left renal calculi no hydronephrosis. The right kidney is mildly enlarged with perinephric stranding. There is moderate right hydronephrosis secondary to a 1.0 cm calculus at the right UPJ. No additional calculi in the right kidney. Urinary bladder is unremarkable. Stomach/Bowel: Stomach is within normal limits. Appendix appears normal. No evidence of bowel wall thickening, distention, or inflammatory changes. Scattered colonic diverticula  without evidence of diverticulitis. Vascular/Lymphatic: No significant vascular findings are present. No enlarged abdominal or pelvic lymph nodes. Reproductive: Uterus and bilateral adnexa are unremarkable. Other: No abdominal wall hernia or abnormality. No abdominopelvic ascites. Musculoskeletal: No acute or significant osseous findings. IMPRESSION: 1. Obstructing 1.0 cm calculus at the right UPJ with associated moderate right hydronephrosis and perinephric stranding. 2. Multiple nonobstructing left renal calculi. Electronically Signed   By: Emmaline Kluver M.D.   On: 09/19/2019 17:47     Labs:   Basic Metabolic Panel: Recent Labs  Lab 09/19/19 1654 09/20/19 0502 09/21/19 0436 09/22/19 0435  NA 136 142 141 141  K 3.9 4.4 4.0 3.3*  CL 104 110 110 107  CO2 GLUCOSE 129* 151* 109* 114*  BUN <5*  CREATININE 1.01* 0.91 0.80 0.63  CALCIUM 10.1 9.4 9.1 9.1   GFR Estimated Creatinine Clearance: 83.2 mL/min (by C-G formula based on SCr of 0.63 mg/dL). Liver Function Tests: Recent Labs  Lab 09/19/19 1932  AST 40  ALT 35  ALKPHOS 85  BILITOT 0.6  PROT 6.1*  ALBUMIN 3.4*   No results for input(s): LIPASE, AMYLASE in the last 168 hours. No results for input(s): AMMONIA in the last 168 hours. Coagulation profile No results for input(s): INR, PROTIME in the last 168 hours.  CBC: Recent Labs  Lab 09/19/19 1654 09/20/19 0502 09/21/19 0436 09/22/19 0435  WBC 14.7* 14.7* 9.8 8.2  HGB 15.5* 13.7 12.4 11.2*  HCT 45.1 42.3 37.7 34.9*  MCV 93.6 98.1 97.4 97.8  PLT 248 153 172 189   Cardiac Enzymes: No results for input(s): CKTOTAL, CKMB, CKMBINDEX, TROPONINI in the last 168 hours. BNP: Invalid input(s): POCBNP CBG: No results for input(s): GLUCAP in the last 168 hours. D-Dimer No results for input(s): DDIMER in the last 72 hours. Hgb A1c No results for input(s): HGBA1C in the last 72 hours. Lipid Profile No results for input(s): CHOL, HDL, LDLCALC,  TRIG, CHOLHDL, LDLDIRECT in the last 72 hours. Thyroid function studies No results for input(s): TSH, T4TOTAL, T3FREE, THYROIDAB in the last 72 hours.  Invalid input(s): FREET3 Anemia work up No results for input(s): VITAMINB12, FOLATE, FERRITIN, TIBC, IRON, RETICCTPCT in the last 72 hours. Microbiology Recent Results (from the past 240 hour(s))  Blood culture (routine x 2)     Status: Abnormal   Collection Time: 09/19/19  5:12 PM   Specimen: BLOOD  Result Value Ref Range Status   Specimen Description   Final    BLOOD LEFT ANTECUBITAL Performed at [facility], [address], [place], [place] [address]    Special Requests   Final    BOTTLES DRAWN AEROBIC AND ANAEROBIC Blood Culture adequate volume Performed at [facility], [address] [place], [place] [address]    Culture  Setup Time  Final    GRAM POSITIVE COCCI IN BOTH AEROBIC AND ANAEROBIC BOTTLES CRITICAL RESULT CALLED TO, READ BACK BY AND VERIFIED WITH: Palma Holter [id number] AT 1451 BY CM Performed at [facility], [address] [place], [place] [address]    Culture STAPHYLOCOCCUS EPIDERMIDIS (A)  Final   Report Status 09/22/2019 FINAL  Final   Organism ID, Bacteria STAPHYLOCOCCUS EPIDERMIDIS  Final      Susceptibility   Staphylococcus epidermidis - MIC*    CIPROFLOXACIN <=0.5 SENSITIVE Sensitive     ERYTHROMYCIN <=0.25 SENSITIVE Sensitive     GENTAMICIN <=0.5 SENSITIVE Sensitive     OXACILLIN <=0.25 SENSITIVE Sensitive     TETRACYCLINE <=1 SENSITIVE Sensitive     VANCOMYCIN 1 SENSITIVE Sensitive     TRIMETH/SULFA <=10 SENSITIVE Sensitive     CLINDAMYCIN <=0.25 SENSITIVE Sensitive     RIFAMPIN <=0.5 SENSITIVE Sensitive     Inducible Clindamycin NEGATIVE Sensitive     * STAPHYLOCOCCUS EPIDERMIDIS  Urine culture     Status: Abnormal   Collection Time: 09/19/19  5:16 PM   Specimen: Urine, Random  Result Value Ref Range Status   Specimen Description   Final     URINE, RANDOM Performed at [facility], [address] [place], [place] [address]    Special Requests   Final    NONE Performed at [facility], [address] [place], [place] [address]    Culture >=100,000 COLONIES/mL STAPHYLOCOCCUS EPIDERMIDIS (A)  Final   Report Status 09/22/2019 FINAL  Final   Organism ID, Bacteria STAPHYLOCOCCUS EPIDERMIDIS (A)  Final      Susceptibility   Staphylococcus epidermidis - MIC*    CIPROFLOXACIN <=0.5 SENSITIVE Sensitive     GENTAMICIN <=0.5 SENSITIVE Sensitive     NITROFURANTOIN <=16 SENSITIVE Sensitive     OXACILLIN <=0.25 SENSITIVE Sensitive     TETRACYCLINE <=1 SENSITIVE Sensitive     VANCOMYCIN 1 SENSITIVE Sensitive     TRIMETH/SULFA <=10 SENSITIVE Sensitive     CLINDAMYCIN <=0.25 SENSITIVE Sensitive     RIFAMPIN <=0.5 SENSITIVE Sensitive     Inducible Clindamycin NEGATIVE Sensitive     * >=100,000 COLONIES/mL STAPHYLOCOCCUS EPIDERMIDIS  SARS Coronavirus 2 by RT PCR (hospital order, performed in [facility] hospital lab) Nasopharyngeal Nasopharyngeal Swab     Status: None   Collection Time: 09/19/19  6:39 PM   Specimen: Nasopharyngeal Swab  Result Value Ref Range Status   SARS Coronavirus 2 NEGATIVE NEGATIVE Final    Comment: (NOTE) If result is NEGATIVE SARS-CoV-2 target nucleic acids are NOT DETECTED. The SARS-CoV-2 RNA is generally detectable in upper and lower  respiratory specimens during the acute phase of infection. The lowest  concentration of SARS-CoV-2 viral copies this assay can detect is 250  copies / mL. A negative result does not preclude SARS-CoV-2 infection  and should not be used as the sole basis for treatment or other  patient management decisions.  A negative result may occur with  improper specimen collection / handling, submission of specimen other  than nasopharyngeal swab, presence of viral mutation(s) within the  areas targeted by this assay, and inadequate number of viral  copies  (<250 copies / mL). A negative result must be combined with clinical  observations, patient history, and epidemiological information. If result is POSITIVE SARS-CoV-2 target nucleic acids are DETECTED. The SARS-CoV-2 RNA is generally detectable in upper and lower  respiratory specimens dur ing the acute phase of infection.  Positive  results  are indicative of active infection with SARS-CoV-2.  Clinical  correlation with patient history and other diagnostic information is  necessary to determine patient infection status.  Positive results do  not rule out bacterial infection or co-infection with other viruses. If result is PRESUMPTIVE POSTIVE SARS-CoV-2 nucleic acids MAY BE PRESENT.   A presumptive positive result was obtained on the submitted specimen  and confirmed on repeat testing.  While 2019 novel coronavirus  (SARS-CoV-2) nucleic acids may be present in the submitted sample  additional confirmatory testing may be necessary for epidemiological  and / or clinical management purposes  to differentiate between  SARS-CoV-2 and other Sarbecovirus currently known to infect humans.  If clinically indicated additional testing with an alternate test  methodology [phone]) is advised. The SARS-CoV-2 RNA is generally  detectable in upper and lower respiratory sp ecimens during the acute  phase of infection. The expected result is Negative. Fact Sheet for Patients:  BoilerBrush.com.cy Fact Sheet for Healthcare Providers: https://pope.com/ This test is not yet approved or cleared by the [place] FDA and has been authorized for detection and/or diagnosis of SARS-CoV-2 by FDA under an Emergency Use Authorization (EUA).  This EUA will remain in effect (meaning this test can be used) for the duration of the COVID-19 declaration under Section 564(b)(1) of the Act, 21 U.S.C. section 360bbb-3(b)(1), unless the authorization is terminated  or revoked sooner. Performed at [facility], [address] [place], [place] [address]   Blood culture (routine x 2)     Status: Abnormal   Collection Time: 09/19/19  7:32 PM   Specimen: BLOOD  Result Value Ref Range Status   Specimen Description   Final    BLOOD RIGHT ANTECUBITAL Performed at [facility], [address] [place], [place] [address]    Special Requests   Final    BOTTLES DRAWN AEROBIC ONLY Blood Culture results may not be optimal due to an inadequate volume of blood received in culture bottles Performed at [facility], [address], [place], [place] [address]    Culture  Setup Time   Final    GRAM POSITIVE COCCI IN CLUSTERS AEROBIC BOTTLE ONLY CRITICAL RESULT CALLED TO, READ BACK BY AND VERIFIED WITH: PHARND M RENZ [id number] AT 658 AM BY CM    Culture (A)  Final    STAPHYLOCOCCUS EPIDERMIDIS SUSCEPTIBILITIES PERFORMED ON PREVIOUS CULTURE WITHIN THE LAST 5 DAYS. Performed at [facility], [address] [place], [place] [address]    Report Status 09/22/2019 FINAL  Final  Blood Culture ID Panel (Reflexed)     Status: Abnormal   Collection Time: 09/19/19  7:32 PM  Result Value Ref Range Status   Enterococcus species NOT DETECTED NOT DETECTED Final   Listeria monocytogenes NOT DETECTED NOT DETECTED Final   Staphylococcus species DETECTED (A) NOT DETECTED Final    Comment: Methicillin (oxacillin) susceptible coagulase negative staphylococcus. Possible blood culture contaminant (unless isolated from more than one blood culture draw or clinical case suggests pathogenicity). No antibiotic treatment is indicated for blood  culture contaminants. CRITICAL RESULT CALLED TO, READ BACK BY AND VERIFIED WITH: PHARMD M RENZ [id number] AT 658 AM BY CM    Staphylococcus aureus (BCID) NOT DETECTED NOT DETECTED Final   Methicillin resistance NOT DETECTED NOT DETECTED Final   Streptococcus species NOT DETECTED NOT  DETECTED Final   Streptococcus agalactiae NOT DETECTED NOT DETECTED Final   Streptococcus pneumoniae NOT DETECTED NOT DETECTED Final   Streptococcus  pyogenes NOT DETECTED NOT DETECTED Final   Acinetobacter baumannii NOT DETECTED NOT DETECTED Final   Enterobacteriaceae species NOT DETECTED NOT DETECTED Final   Enterobacter cloacae complex NOT DETECTED NOT DETECTED Final   Escherichia coli NOT DETECTED NOT DETECTED Final   Klebsiella oxytoca NOT DETECTED NOT DETECTED Final   Klebsiella pneumoniae NOT DETECTED NOT DETECTED Final   Proteus species NOT DETECTED NOT DETECTED Final   Serratia marcescens NOT DETECTED NOT DETECTED Final   Haemophilus influenzae NOT DETECTED NOT DETECTED Final   Neisseria meningitidis NOT DETECTED NOT DETECTED Final   Pseudomonas aeruginosa NOT DETECTED NOT DETECTED Final   Candida albicans NOT DETECTED NOT DETECTED Final   Candida glabrata NOT DETECTED NOT DETECTED Final   Candida krusei NOT DETECTED NOT DETECTED Final   Candida parapsilosis NOT DETECTED NOT DETECTED Final   Candida tropicalis NOT DETECTED NOT DETECTED Final    Comment: Performed at [facility], [place] [address] [place], [place] [address]  Urine Culture     Status: Abnormal   Collection Time: 09/19/19  9:00 PM   Specimen: Urine, Cystoscope  Result Value Ref Range Status   Specimen Description CYSTOSCOPY  Final   Special Requests   Final    URINE PATIENT ON FOLLOWING ROCEPHIN Performed at [facility], [address], [place], [place] [address]    Culture (A)  Final    >=100,000 COLONIES/mL STAPHYLOCOCCUS EPIDERMIDIS >=100,000 COLONIES/mL ENTEROCOCCUS FAECALIS    Report Status 09/23/2019 FINAL  Final   Organism ID, Bacteria STAPHYLOCOCCUS EPIDERMIDIS (A)  Final   Organism ID, Bacteria ENTEROCOCCUS FAECALIS (A)  Final      Susceptibility   Enterococcus faecalis - MIC*    AMPICILLIN <=2 SENSITIVE Sensitive     VANCOMYCIN 1 SENSITIVE Sensitive     GENTAMICIN SYNERGY SENSITIVE  Sensitive     * >=100,000 COLONIES/mL ENTEROCOCCUS FAECALIS   Staphylococcus epidermidis - MIC*    CIPROFLOXACIN <=0.5 SENSITIVE Sensitive     ERYTHROMYCIN <=0.25 SENSITIVE Sensitive     GENTAMICIN <=0.5 SENSITIVE Sensitive     OXACILLIN <=0.25 SENSITIVE Sensitive     TETRACYCLINE <=1 SENSITIVE Sensitive     VANCOMYCIN 1 SENSITIVE Sensitive     TRIMETH/SULFA <=10 SENSITIVE Sensitive     CLINDAMYCIN <=0.25 SENSITIVE Sensitive     RIFAMPIN <=0.5 SENSITIVE Sensitive     Inducible Clindamycin NEGATIVE Sensitive     * >=100,000 COLONIES/mL STAPHYLOCOCCUS EPIDERMIDIS  Urine culture     Status: Abnormal   Collection Time: 09/20/19  1:07 AM   Specimen: Urine, Random  Result Value Ref Range Status   Specimen Description   Final    URINE, RANDOM Performed at [place] [address] [place], [place] [address]    Special Requests   Final    NONE Performed at [facility], [place] [address] [place], [place] [address]    Culture 20,000 COLONIES/mL ENTEROCOCCUS FAECALIS (A)  Final   Report Status 09/23/2019 FINAL  Final   Organism ID, Bacteria ENTEROCOCCUS FAECALIS (A)  Final      Susceptibility   Enterococcus faecalis - MIC*    AMPICILLIN <=2 SENSITIVE Sensitive     LEVOFLOXACIN 1 SENSITIVE Sensitive     NITROFURANTOIN <=16 SENSITIVE Sensitive     VANCOMYCIN 1 SENSITIVE Sensitive     * 20,000 COLONIES/mL ENTEROCOCCUS FAECALIS  Culture, blood (routine x 2)     Status: None (Preliminary result)   Collection Time: 09/21/19  1:18 PM  Specimen: BLOOD RIGHT HAND  Result Value Ref Range Status   Specimen Description   Final    BLOOD RIGHT HAND Performed at [facility], [address] [place], [place] [id number]    Special Requests   Final    BOTTLES DRAWN AEROBIC AND ANAEROBIC Blood Culture adequate volume Performed at [facility], [address] [place], [place] [id number]    Culture   Final    NO GROWTH 2  DAYS Performed at [facility], [address] [place], [place] [id number]    Report Status PENDING  Incomplete  Culture, blood (routine x 2)     Status: None (Preliminary result)   Collection Time: 09/21/19  1:24 PM   Specimen: Left Antecubital; Blood  Result Value Ref Range Status   Specimen Description   Final    LEFT ANTECUBITAL Performed at [facility], [address] [place], [place] [address]    Special Requests   Final    BOTTLES DRAWN AEROBIC AND ANAEROBIC Blood Culture adequate volume Performed at [facility], [address] [place], [place] [address]    Culture   Final    NO GROWTH 2 DAYS Performed at [facility], [address] [place], [place] [address]    Report Status PENDING  Incomplete    Please note: You were cared for by a hospitalist during your hospital stay. Once you are discharged, your primary care physician will handle any further medical issues. Please note that NO REFILLS for any discharge medications will be authorized once you are discharged, as it is imperative that you return to your primary care physician (or establish a relationship with a primary care physician if you do not have one) for your post hospital discharge needs so that they can reassess your need for medications and monitor your lab values.  Signed: Lorin GlassBinaya Kerah Hardebeck  [facility] 09/23/2019, 11:20 AM

## 2019-09-26 LAB — CULTURE, BLOOD (ROUTINE X 2)
Culture: NO GROWTH
Culture: NO GROWTH
Special Requests: ADEQUATE
Special Requests: ADEQUATE

## 2019-09-30 NOTE — Patient Instructions (Addendum)
DUE TO COVID-19 ONLY ONE VISITOR IS ALLOWED TO COME WITH YOU AND STAY IN THE WAITING ROOM ONLY DURING PRE OP AND PROCEDURE DAY OF SURGERY. THE 1 VISITOR MAY VISIT WITH YOU AFTER SURGERY IN YOUR PRIVATE ROOM DURING VISITING HOURS ONLY!  YOU NEED TO HAVE A COVID 19 TEST ON 10-01-19 @ 9:20 AM, THIS TEST MUST BE DONE BEFORE SURGERY, COME  [address], [place] , [address].  ([place]) ONCE YOUR COVID TEST IS COMPLETED, PLEASE BEGIN THE QUARANTINE INSTRUCTIONS AS OUTLINED IN YOUR HANDOUT.                Billee Balcerzak  09/30/2019   Your procedure is scheduled on: 10-05-19    Report to [facility] Main  Entrance    Report to Admitting at [place]     Call this number if you have problems the morning of surgery [phone]    Remember: Do not eat food or drink liquids :After Midnight. BRUSH YOUR TEETH MORNING OF SURGERY AND RINSE YOUR MOUTH OUT, NO CHEWING GUM CANDY OR MINTS.     Take these medicines the morning of surgery with A SIP OF WATER: Augmentin, Wellbutrin,                                  You may not have any metal on your body including hair pins and              piercings     Do not wear jewelry, make-up, lotions, powders or perfumes, deodorant             Do not wear nail polish on your fingernails.  Do not shave  48 hours prior to surgery.     Do not bring valuables to the hospital. [place].  Contacts, dentures or bridgework may not be worn into surgery.      Patients discharged the day of surgery will not be allowed to drive home. IF YOU ARE HAVING SURGERY AND GOING HOME THE SAME DAY, YOU MUST HAVE AN ADULT TO DRIVE YOU HOME AND BE WITH YOU FOR 24 HOURS. YOU MAY GO HOME BY TAXI OR UBER OR ORTHERWISE, BUT AN ADULT MUST ACCOMPANY YOU HOME AND STAY WITH YOU FOR 24 HOURS.  Name and phone number of your driver: Cyenna Rebello [phone]  Special Instructions: N/A              Please read over  the following fact sheets you were given: _____________________________________________________________________             [facility] - Preparing for Surgery Before surgery, you can play an important role.  Because skin is not sterile, your skin needs to be as free of germs as possible.  You can reduce the number of germs on your skin by washing with CHG (chlorahexidine gluconate) soap before surgery.  CHG is an antiseptic cleaner which kills germs and bonds with the skin to continue killing germs even after washing. Please DO NOT use if you have an allergy to CHG or antibacterial soaps.  If your skin becomes reddened/irritated stop using the CHG and inform your nurse when you arrive at Short Stay. Do not shave (including legs and underarms) for at least 48 hours prior to the first CHG shower.  You may  shave your face/neck. Please follow these instructions carefully:  1.  Shower with CHG Soap the night before surgery and the  morning of Surgery.  2.  If you choose to wash your hair, wash your hair first as usual with your  normal  shampoo.  3.  After you shampoo, rinse your hair and body thoroughly to remove the  shampoo.                           4.  Use CHG as you would any other liquid soap.  You can apply chg directly  to the skin and wash                       Gently with a scrungie or clean washcloth.  5.  Apply the CHG Soap to your body ONLY FROM THE NECK DOWN.   Do not use on face/ open                           Wound or open sores. Avoid contact with eyes, ears mouth and genitals (private parts).                       Wash face,  Genitals (private parts) with your normal soap.             6.  Wash thoroughly, paying special attention to the area where your surgery  will be performed.  7.  Thoroughly rinse your body with warm water from the neck down.  8.  DO NOT shower/wash with your normal soap after using and rinsing off  the CHG Soap.                9.  Pat yourself dry with a  clean towel.            10.  Wear clean pajamas.            11.  Place clean sheets on your bed the night of your first shower and do not  sleep with pets. Day of Surgery : Do not apply any lotions/deodorants the morning of surgery.  Please wear clean clothes to the hospital/surgery center.  FAILURE TO FOLLOW THESE INSTRUCTIONS MAY RESULT IN THE CANCELLATION OF YOUR SURGERY PATIENT SIGNATURE_________________________________  NURSE SIGNATURE__________________________________  ________________________________________________________________________

## 2019-09-30 NOTE — Progress Notes (Signed)
Need orders. Pre-op appt 11-19. Thanks

## 2019-10-01 ENCOUNTER — Encounter (HOSPITAL_COMMUNITY): Payer: Self-pay

## 2019-10-01 ENCOUNTER — Encounter (HOSPITAL_COMMUNITY)
Admission: RE | Admit: 2019-10-01 | Discharge: 2019-10-01 | Disposition: A | Discharge status: Home / Self Care | Payer: Managed Care, Other (non HMO) | Source: Ambulatory Visit | Attending: Urology | Admitting: Urology

## 2019-10-01 ENCOUNTER — Other Ambulatory Visit (HOSPITAL_COMMUNITY)
Admission: RE | Admit: 2019-10-01 | Discharge: 2019-10-01 | Disposition: A | Discharge status: Home / Self Care | Payer: Managed Care, Other (non HMO) | Source: Ambulatory Visit | Attending: Urology | Admitting: Urology

## 2019-10-01 ENCOUNTER — Other Ambulatory Visit: Payer: Self-pay

## 2019-10-01 DIAGNOSIS — Z01812 Encounter for preprocedural laboratory examination: Secondary | ICD-10-CM | POA: Diagnosis present

## 2019-10-01 DIAGNOSIS — Z20828 Contact with and (suspected) exposure to other viral communicable diseases: Secondary | ICD-10-CM | POA: Insufficient documentation

## 2019-10-01 LAB — CBC
HCT: 43.2 % (ref 36.0–46.0)
Hemoglobin: 14.1 g/dL (ref 12.0–15.0)
MCH: 31.5 pg (ref 26.0–34.0)
MCHC: 32.6 g/dL (ref 30.0–36.0)
MCV: 96.4 fL (ref 80.0–100.0)
Platelets: 395 10*3/uL (ref 150–400)
RBC: 4.48 MIL/uL (ref 3.87–5.11)
RDW: 12.7 % (ref 11.5–15.5)
WBC: 9.8 10*3/uL (ref 4.0–10.5)
nRBC: 0 % (ref 0.0–0.2)

## 2019-10-01 LAB — BASIC METABOLIC PANEL
Anion gap: 10 (ref 5–15)
BUN: 16 mg/dL (ref 6–20)
CO2: 25 mmol/L (ref 22–32)
Calcium: 10.6 mg/dL — ABNORMAL HIGH (ref 8.9–10.3)
Chloride: 101 mmol/L (ref 98–111)
Creatinine, Ser: 0.83 mg/dL (ref 0.44–1.00)
GFR calc Af Amer: >60 mL/min (ref >60)
GFR calc non Af Amer: >60 mL/min (ref >60)
Glucose, Bld: 102 mg/dL — ABNORMAL HIGH (ref 70–99)
Potassium: 4.8 mmol/L (ref 3.5–5.1)
Sodium: 136 mmol/L (ref 135–145)

## 2019-10-02 ENCOUNTER — Other Ambulatory Visit: Payer: Self-pay | Admitting: Urology

## 2019-10-02 LAB — NOVEL CORONAVIRUS, NAA (HOSP ORDER, SEND-OUT TO REF LAB; TAT 18-24 HRS): SARS-CoV-2, NAA: NOT DETECTED

## 2019-10-02 NOTE — Progress Notes (Signed)
Left message for Kristin [name] at [facility] requesting orders.

## 2019-10-05 ENCOUNTER — Encounter (HOSPITAL_COMMUNITY)
Admission: RE | Disposition: A | Discharge status: Home / Self Care | Payer: Self-pay | Source: Home / Self Care | Attending: Urology

## 2019-10-05 ENCOUNTER — Ambulatory Visit (HOSPITAL_COMMUNITY): Payer: Managed Care, Other (non HMO) | Admitting: Physician Assistant

## 2019-10-05 ENCOUNTER — Encounter (HOSPITAL_COMMUNITY): Payer: Self-pay | Admitting: Emergency Medicine

## 2019-10-05 ENCOUNTER — Ambulatory Visit (HOSPITAL_COMMUNITY)
Admission: RE | Admit: 2019-10-05 | Discharge: 2019-10-05 | Disposition: A | Discharge status: Home / Self Care | Payer: Managed Care, Other (non HMO) | Attending: Urology | Admitting: Urology

## 2019-10-05 ENCOUNTER — Ambulatory Visit (HOSPITAL_COMMUNITY): Payer: Managed Care, Other (non HMO) | Admitting: Anesthesiology

## 2019-10-05 ENCOUNTER — Other Ambulatory Visit: Payer: Self-pay

## 2019-10-05 ENCOUNTER — Ambulatory Visit (HOSPITAL_COMMUNITY): Payer: Managed Care, Other (non HMO)

## 2019-10-05 DIAGNOSIS — F329 Major depressive disorder, single episode, unspecified: Secondary | ICD-10-CM | POA: Insufficient documentation

## 2019-10-05 DIAGNOSIS — Z87442 Personal history of urinary calculi: Secondary | ICD-10-CM | POA: Insufficient documentation

## 2019-10-05 DIAGNOSIS — Z87440 Personal history of urinary (tract) infections: Secondary | ICD-10-CM | POA: Insufficient documentation

## 2019-10-05 DIAGNOSIS — F419 Anxiety disorder, unspecified: Secondary | ICD-10-CM | POA: Insufficient documentation

## 2019-10-05 DIAGNOSIS — Z7982 Long term (current) use of aspirin: Secondary | ICD-10-CM | POA: Diagnosis not present

## 2019-10-05 DIAGNOSIS — N200 Calculus of kidney: Secondary | ICD-10-CM | POA: Diagnosis present

## 2019-10-05 DIAGNOSIS — Z79899 Other long term (current) drug therapy: Secondary | ICD-10-CM | POA: Diagnosis not present

## 2019-10-05 DIAGNOSIS — Z86718 Personal history of other venous thrombosis and embolism: Secondary | ICD-10-CM | POA: Diagnosis not present

## 2019-10-05 DIAGNOSIS — Z87891 Personal history of nicotine dependence: Secondary | ICD-10-CM | POA: Insufficient documentation

## 2019-10-05 HISTORY — PX: CYSTOSCOPY/URETEROSCOPY/HOLMIUM LASER/STENT PLACEMENT: SHX6546

## 2019-10-05 SURGERY — CYSTOSCOPY/URETEROSCOPY/HOLMIUM LASER/STENT PLACEMENT
Anesthesia: General | Site: Ureter | Laterality: Right

## 2019-10-05 MED ORDER — DEXAMETHASONE SODIUM PHOSPHATE 10 MG/ML IJ SOLN
INTRAMUSCULAR | Status: DC | PRN
  Administered 2019-10-05: 10 mg via INTRAVENOUS

## 2019-10-05 MED ORDER — PHENYLEPHRINE HCL (PRESSORS) 10 MG/ML IV SOLN
INTRAVENOUS | Status: AC
  Filled 2019-10-05: qty 1

## 2019-10-05 MED ORDER — DEXAMETHASONE SODIUM PHOSPHATE 10 MG/ML IJ SOLN
INTRAMUSCULAR | Status: AC
  Filled 2019-10-05: qty 2

## 2019-10-05 MED ORDER — PHENYLEPHRINE 40 MCG/ML (10ML) SYRINGE FOR IV PUSH (FOR BLOOD PRESSURE SUPPORT)
PREFILLED_SYRINGE | INTRAVENOUS | Status: AC
  Filled 2019-10-05: qty 20

## 2019-10-05 MED ORDER — LACTATED RINGERS IV SOLN
INTRAVENOUS | Status: DC
  Administered 2019-10-05: 08:00:00 via INTRAVENOUS

## 2019-10-05 MED ORDER — ROCURONIUM BROMIDE 10 MG/ML (PF) SYRINGE
PREFILLED_SYRINGE | INTRAVENOUS | Status: AC
  Filled 2019-10-05: qty 10

## 2019-10-05 MED ORDER — PROPOFOL 500 MG/50ML IV EMUL
INTRAVENOUS | Status: AC
  Filled 2019-10-05: qty 50

## 2019-10-05 MED ORDER — MIDAZOLAM HCL 2 MG/2ML IJ SOLN
INTRAMUSCULAR | Status: AC
  Filled 2019-10-05: qty 2

## 2019-10-05 MED ORDER — ONDANSETRON HCL 4 MG/2ML IJ SOLN
INTRAMUSCULAR | Status: AC
  Filled 2019-10-05: qty 4

## 2019-10-05 MED ORDER — PROPOFOL 10 MG/ML IV BOLUS
INTRAVENOUS | Status: AC
  Filled 2019-10-05: qty 20

## 2019-10-05 MED ORDER — SUCCINYLCHOLINE CHLORIDE 200 MG/10ML IV SOSY
PREFILLED_SYRINGE | INTRAVENOUS | Status: AC
  Filled 2019-10-05: qty 10

## 2019-10-05 MED ORDER — LIDOCAINE 2% (20 MG/ML) 5 ML SYRINGE
INTRAMUSCULAR | Status: DC | PRN
  Administered 2019-10-05: 60 mg via INTRAVENOUS

## 2019-10-05 MED ORDER — FENTANYL CITRATE (PF) 250 MCG/5ML IJ SOLN
INTRAMUSCULAR | Status: DC | PRN
  Administered 2019-10-05: 50 ug via INTRAVENOUS
  Administered 2019-10-05: 100 ug via INTRAVENOUS
  Administered 2019-10-05 (×2): 50 ug via INTRAVENOUS

## 2019-10-05 MED ORDER — SODIUM CHLORIDE 0.9 % IR SOLN
Status: DC | PRN
  Administered 2019-10-05: 3000 mL

## 2019-10-05 MED ORDER — IOHEXOL 300 MG/ML  SOLN
INTRAMUSCULAR | Status: DC | PRN
  Administered 2019-10-05: 4 mL via URETHRAL

## 2019-10-05 MED ORDER — MIDAZOLAM HCL 2 MG/2ML IJ SOLN
INTRAMUSCULAR | Status: DC | PRN
  Administered 2019-10-05: 2 mg via INTRAVENOUS

## 2019-10-05 MED ORDER — FENTANYL CITRATE (PF) 250 MCG/5ML IJ SOLN
INTRAMUSCULAR | Status: AC
  Filled 2019-10-05: qty 5

## 2019-10-05 MED ORDER — CIPROFLOXACIN IN D5W 400 MG/200ML IV SOLN
400.0000 mg | Freq: Two times a day (BID) | INTRAVENOUS | Status: DC
  Administered 2019-10-05: 400 mg via INTRAVENOUS
  Filled 2019-10-05: qty 200

## 2019-10-05 MED ORDER — ONDANSETRON HCL 4 MG/2ML IJ SOLN
INTRAMUSCULAR | Status: DC | PRN
  Administered 2019-10-05: 4 mg via INTRAVENOUS

## 2019-10-05 MED ORDER — PROPOFOL 10 MG/ML IV BOLUS
INTRAVENOUS | Status: DC | PRN
  Administered 2019-10-05: 140 mg via INTRAVENOUS

## 2019-10-05 MED ORDER — EPHEDRINE 5 MG/ML INJ
INTRAVENOUS | Status: AC
  Filled 2019-10-05: qty 10

## 2019-10-05 MED ORDER — PHENYLEPHRINE 40 MCG/ML (10ML) SYRINGE FOR IV PUSH (FOR BLOOD PRESSURE SUPPORT)
PREFILLED_SYRINGE | INTRAVENOUS | Status: DC | PRN
  Administered 2019-10-05: 160 ug via INTRAVENOUS
  Administered 2019-10-05 (×2): 80 ug via INTRAVENOUS
  Administered 2019-10-05: 120 ug via INTRAVENOUS

## 2019-10-05 SURGICAL SUPPLY — 24 items
BAG URO CATCHER STRL LF (MISCELLANEOUS) ×3 IMPLANT
BASKET LASER NITINOL 1.9FR (BASKET) IMPLANT
BASKET ZERO TIP NITINOL 2.4FR (BASKET) IMPLANT
BSKT STON RTRVL 120 1.9FR (BASKET)
BSKT STON RTRVL ZERO TP 2.4FR (BASKET)
CATH INTERMIT  6FR 70CM (CATHETERS) ×3 IMPLANT
CLOTH BEACON ORANGE TIMEOUT ST (SAFETY) ×3 IMPLANT
EXTRACTOR STONE 1.7FRX115CM (UROLOGICAL SUPPLIES) IMPLANT
FIBER LASER FLEXIVA 365 (UROLOGICAL SUPPLIES) IMPLANT
FIBER LASER TRAC TIP (UROLOGICAL SUPPLIES) ×2 IMPLANT
GLOVE BIO SURGEON STRL SZ7.5 (GLOVE) ×3 IMPLANT
GOWN STRL REUS W/TWL XL LVL3 (GOWN DISPOSABLE) ×3 IMPLANT
GUIDEWIRE ANG ZIPWIRE 038X150 (WIRE) IMPLANT
GUIDEWIRE STR DUAL SENSOR (WIRE) ×3 IMPLANT
KIT TURNOVER KIT A (KITS) IMPLANT
MANIFOLD NEPTUNE II (INSTRUMENTS) ×3 IMPLANT
PACK CYSTO (CUSTOM PROCEDURE TRAY) ×3 IMPLANT
PENCIL SMOKE EVACUATOR (MISCELLANEOUS) IMPLANT
SHEATH URETERAL 12FRX28CM (UROLOGICAL SUPPLIES) IMPLANT
SHEATH URETERAL 12FRX35CM (MISCELLANEOUS) ×2 IMPLANT
STENT URET 6FRX24 CONTOUR (STENTS) ×2 IMPLANT
TUBING CONNECTING 10 (TUBING) ×2 IMPLANT
TUBING CONNECTING 10' (TUBING) ×1
TUBING UROLOGY SET (TUBING) ×3 IMPLANT

## 2019-10-05 NOTE — Transfer of Care (Signed)
Immediate Anesthesia Transfer of Care Note  Patient: Kristin [name]  Procedure(s) Performed: CYSTOSCOPY RIGHT RETROGRADE PYELOGRAM URETEROSCOPY/HOLMIUM LASER/STENT PLACEMENT (Right Ureter)  Patient Location: PACU  Anesthesia Type:General  Level of Consciousness: awake and alert   Airway & Oxygen Therapy: Patient Spontanous Breathing and Patient connected to face mask oxygen  Post-op Assessment: Report given to RN and Post -op Vital signs reviewed and stable  Post vital signs: Reviewed and stable  Last Vitals:  Vitals Value Taken Time  BP    Temp    Pulse    Resp    SpO2      Last Pain:  Vitals:   10/05/19 0815  TempSrc:   PainSc: 2       Patients Stated Pain Goal: 4 (67/89/38 1017)  Complications: No apparent anesthesia complications

## 2019-10-05 NOTE — Anesthesia Procedure Notes (Signed)
Procedure Name: LMA Insertion Date/Time: 10/05/2019 10:04 AM Performed by: Cynda Familia, CRNA Pre-anesthesia Checklist: Patient identified, Emergency Drugs available, Suction available and Patient being monitored Patient Re-evaluated:Patient Re-evaluated prior to induction Oxygen Delivery Method: Circle System Utilized Preoxygenation: Pre-oxygenation with 100% oxygen Induction Type: IV induction Ventilation: Mask ventilation without difficulty LMA: LMA inserted and LMA with gastric port inserted LMA Size: 4.0 Number of attempts: 1 Placement Confirmation: positive ETCO2 Tube secured with: Tape Dental Injury: Teeth and Oropharynx as per pre-operative assessment  Comments: Smooth IV induction AM CRNA with atraumatic LMA insertion bilat BS

## 2019-10-05 NOTE — Anesthesia Preprocedure Evaluation (Signed)
Anesthesia Evaluation  Patient identified by MRN, date of birth, ID band Patient awake    Reviewed: Allergy & Precautions, H&P , NPO status , Patient's Chart, lab work & pertinent test results, reviewed documented beta blocker date and time   Airway Mallampati: II  TM Distance: >3 FB Neck ROM: full    Dental no notable dental hx.    Pulmonary neg pulmonary ROS, former smoker,    Pulmonary exam normal breath sounds clear to auscultation       Cardiovascular Exercise Tolerance: Good negative cardio ROS   Rhythm:regular Rate:Normal     Neuro/Psych negative neurological ROS  negative psych ROS   GI/Hepatic negative GI ROS, Neg liver ROS,   Endo/Other  negative endocrine ROS  Renal/GU negative Renal ROS  negative genitourinary   Musculoskeletal   Abdominal   Peds  Hematology negative hematology ROS (+)   Anesthesia Other Findings   Reproductive/Obstetrics negative OB ROS                             Anesthesia Physical Anesthesia Plan  ASA: II  Anesthesia Plan: General   Post-op Pain Management:    Induction:   PONV Risk Score and Plan:   Airway Management Planned: LMA  Additional Equipment:   Intra-op Plan:   Post-operative Plan: Extubation in OR  Informed Consent: I have reviewed the patients History and Physical, chart, labs and discussed the procedure including the risks, benefits and alternatives for the proposed anesthesia with the patient or authorized representative who has indicated his/her understanding and acceptance.     Dental Advisory Given  Plan Discussed with: CRNA, Anesthesiologist and Surgeon  Anesthesia Plan Comments: (Discussed both nerve block for pain relief post-op and GA; including NV, sore throat, dental injury, and pulmonary complications)        Anesthesia Quick Evaluation

## 2019-10-05 NOTE — Discharge Instructions (Addendum)
[facility] °[phone] °Post Ureteroscopy With or Without Stent Instructions ° °Definitions: ° °Ureter: The duct that transports urine from the kidney to the bladder. °Stent:   A plastic hollow tube that is placed into the ureter, from the kidney to the                 bladder to prevent the ureter from swelling shut. ° °GENERAL INSTRUCTIONS: ° °Despite the fact that no skin incisions were used, the area around the ureter and bladder is raw and irritated. The stent is a foreign body which will further irritate the bladder wall. This irritation is manifested by increased frequency of urination, both day and night, and by an increase in the urge to urinate. In some, the urge to urinate is present almost always. Sometimes the urge is strong enough that you may not be able to stop yourself from urinating. The only real cure is to remove the stent and then give time for the bladder wall to heal which can't be done until the danger of the ureter swelling shut has passed, which varies. ° °You may see some blood in your urine while the stent is in place and a few days afterwards. Do not be alarmed, even if the urine was clear for a while. Get off your feet and drink lots of fluids until clearing occurs. If you start to pass clots or don't improve, call us. ° °DIET: °You may return to your normal diet immediately. Because of the raw surface of your bladder, alcohol, spicy foods, acid type foods and drinks with caffeine may cause irritation or frequency and should be used in moderation. To keep your urine flowing freely and to avoid constipation, drink plenty of fluids during the day ( 8-10 glasses ). °Tip: Avoid cranberry juice because it is very acidic. ° °ACTIVITY: °Your physical activity doesn't need to be restricted. However, if you are very active, you may see some blood in your urine. We suggest that you reduce your activity under these circumstances until the bleeding has stopped. ° °BOWELS: °It is  important to keep your bowels regular during the postoperative period. Straining with bowel movements can cause bleeding. A bowel movement every other day is reasonable. Use a mild laxative if needed, such as Milk of Magnesia 2-3 tablespoons, or 2 Dulcolax tablets. Call if you continue to have problems. If you have been taking narcotics for pain, before, during or after your surgery, you may be constipated. Take a laxative if necessary. ° ° °MEDICATION: °You should resume your pre-surgery medications unless told not to. °You may take oxybutynin or flomax if prescribed for bladder spasms or discomfort from the stent °Take pain medication as directed for pain refractory to conservative management ° °PROBLEMS YOU SHOULD REPORT TO US: °· Fevers over 100.5 Fahrenheit. °· Heavy bleeding, or clots ( See above notes about blood in urine ). °· Inability to urinate. °· Drug reactions ( hives, rash, nausea, vomiting, diarrhea ). °· Severe burning or pain with urination that is not improving. ° ° °General Anesthesia, Adult, Care After °This sheet gives you information about how to care for yourself after your procedure. Your health care provider may also give you more specific instructions. If you have problems or questions, contact your health care provider. °What can I expect after the procedure? °After the procedure, the following side effects are common: °· Pain or discomfort at the IV site. °· Nausea. °· Vomiting. °· Sore throat. °· Trouble concentrating. °· Feeling   cold or chills. °· Weak or tired. °· Sleepiness and fatigue. °· Soreness and body aches. These side effects can affect parts of the body that were not involved in surgery. °Follow these instructions at home: ° °For at least 24 hours after the procedure: °· Have a responsible adult stay with you. It is important to have someone help care for you until you are awake and alert. °· Rest as needed. °· Do not: °? Participate in activities in which you could fall or  become injured. °? Drive. °? Use heavy machinery. °? Drink alcohol. °? Take sleeping pills or medicines that cause drowsiness. °? Make important decisions or sign legal documents. °? Take care of children on your own. °Eating and drinking °· Follow any instructions from your health care provider about eating or drinking restrictions. °· When you feel hungry, start by eating small amounts of foods that are soft and easy to digest (bland), such as toast. Gradually return to your regular diet. °· Drink enough fluid to keep your urine pale yellow. °· If you vomit, rehydrate by drinking water, juice, or clear broth. °General instructions °· If you have sleep apnea, surgery and certain medicines can increase your risk for breathing problems. Follow instructions from your health care provider about wearing your sleep device: °? Anytime you are sleeping, including during daytime naps. °? While taking prescription pain medicines, sleeping medicines, or medicines that make you drowsy. °· Return to your normal activities as told by your health care provider. Ask your health care provider what activities are safe for you. °· Take over-the-counter and prescription medicines only as told by your health care provider. °· If you smoke, do not smoke without supervision. °· Keep all follow-up visits as told by your health care provider. This is important. °Contact a health care provider if: °· You have nausea or vomiting that does not get better with medicine. °· You cannot eat or drink without vomiting. °· You have pain that does not get better with medicine. °· You are unable to pass urine. °· You develop a skin rash. °· You have a fever. °· You have redness around your IV site that gets worse. °Get help right away if: °· You have difficulty breathing. °· You have chest pain. °· You have blood in your urine or stool, or you vomit blood. °Summary °· After the procedure, it is common to have a sore throat or nausea. It is also common  to feel tired. °· Have a responsible adult stay with you for the first 24 hours after general anesthesia. It is important to have someone help care for you until you are awake and alert. °· When you feel hungry, start by eating small amounts of foods that are soft and easy to digest (bland), such as toast. Gradually return to your regular diet. °· Drink enough fluid to keep your urine pale yellow. °· Return to your normal activities as told by your health care provider. Ask your health care provider what activities are safe for you. °This information is not intended to replace advice given to you by your health care provider. Make sure you discuss any questions you have with your health care provider. °Document Released: 02/04/2001 Document Revised: 11/01/2017 Document Reviewed: 06/14/2017 °Elsevier Patient Education © 2020 [employer] ° ° °

## 2019-10-05 NOTE — Interval H&P Note (Signed)
History and Physical Interval Note:  10/05/2019 8:30 AM  Kristin [name]  has presented today for surgery, with the diagnosis of RIGHT URETERAL STONE.  The various methods of treatment have been discussed with the patient and family. After consideration of risks, benefits and other options for treatment, the patient has consented to  Procedure(s): CYSTOSCOPY RIGHT RETROGRADE PYELOGRAM URETEROSCOPY/HOLMIUM LASER/STENT PLACEMENT (Right) as a surgical intervention.  The patient's history has been reviewed, patient examined, no change in status, stable for surgery.  I have reviewed the patient's chart and labs.  Questions were answered to the patient's satisfaction.     Marton Redwood, III

## 2019-10-05 NOTE — Progress Notes (Signed)
Per Dr. Ambrose Pancoast, no pregnancy test needed prior to surgery.

## 2019-10-05 NOTE — Anesthesia Postprocedure Evaluation (Signed)
Anesthesia Post Note  Patient: Nimah Uphoff  Procedure(s) Performed: CYSTOSCOPY RIGHT RETROGRADE PYELOGRAM URETEROSCOPY/HOLMIUM LASER/STENT PLACEMENT (Right Ureter)     Patient location during evaluation: PACU Anesthesia Type: General Level of consciousness: awake and alert Pain management: pain level controlled Vital Signs Assessment: post-procedure vital signs reviewed and stable Respiratory status: spontaneous breathing, nonlabored ventilation, respiratory function stable and patient connected to nasal cannula oxygen Cardiovascular status: blood pressure returned to baseline and stable Postop Assessment: no apparent nausea or vomiting Anesthetic complications: no    Last Vitals:  Vitals:   10/05/19 0759 10/05/19 1100  BP: (!) 150/95   Pulse: 100 (P) 75  Resp: 18 (P) 15  Temp: 36.7 C (P) 36.4 C  SpO2: 100%     Last Pain:  Vitals:   10/05/19 0815  TempSrc:   PainSc: 2                  Mackenzy Grumbine

## 2019-10-05 NOTE — Op Note (Signed)
Operative Note  Preoperative diagnosis:  1. Right renal calculus  Postoperative diagnosis: 1. Right renal calculus  Procedure(s): 1. Cystoscopy, right retrograde pyelogram, right ureteroscopy with laser lithotripsy, ureteral stent placement/replacement  Surgeon: Link Snuffer, MD  Assistants: None  Anesthesia: General  Complications: None immediate  EBL: Minimal  Specimens: 1. None  Drains/Catheters: 1. 6 x 24 double-J ureteral stent  Intraoperative findings: 1. Normal urethra and bladder 2. Right retrograde pyelogram revealed no evidence of hydronephrosis. There was no significant filling defect.  3. Ureteroscopy confirmed a 1 cm calculus that was fragmented to tiny fragments.  Indication: Kristin [name] status post urgent ureteral stent placement for an obstructing stone with urinary tract infection presents for the previously mentioned operation.  Description of procedure:  The patient was identified and consent was obtained.  The patient was taken to the operating room and placed in the supine position.  The patient was placed under general anesthesia.  Perioperative antibiotics were administered.  The patient was placed in dorsal lithotomy.  Patient was prepped and draped in a standard sterile fashion and a timeout was performed.  A 21 French rigid cystoscope was advanced into the urethra and into the bladder. Complete cystoscopy was performed with no abnormal findings. The stent was grasped and pulled just beyond the meatus. A sensor wire was advanced through this up to the kidney under fluoroscopic guidance and the stent was withdrawn. A 12 x 14 ureteral access sheath was advanced over the wire under continuous fluoroscopic guidance. The inner sheath was withdrawn and a 2nd wire was advanced through the sheath and into the kidney under fluoroscopic guidance. The sheath was withdrawn. One of the wires was secured to the drape as a safety wire and the other wire was used  to readvanced the access sheath over the wire under continuous fluoroscopic guidance up to the level of the renal pelvis followed by removal of the inner sheath and wire. Digital ureteroscopy was performed and the stone was identified. This was laser fragmented to tiny fragments. I performed a complete pyeloscopy and no other clinically significant stone fragments were seen. I shot a retrograde pyelogram through the scope with findings noted above. I then carefully withdrew the scope along with the access sheath visualizing the entire ureter upon removal. There was no significant ureteral trauma and no ureteral calculi seen. I backloaded the wire onto the rigid cystoscope followed by routine placement of a 6 x 24 double-J ureteral stent. Fluoroscopy confirmed proximal placement and direct visualization confirmed a good coil within the bladder. I drained the bladder and withdrew the scope. Patient tolerated procedure well and was stable postoperatively.  Plan: Return in 1 week for stent removal

## 2019-10-05 NOTE — Anesthesia Procedure Notes (Signed)
Date/Time: 10/05/2019 10:47 AM Performed by: Cynda Familia, CRNA Oxygen Delivery Method: Simple face mask Placement Confirmation: positive ETCO2 and breath sounds checked- equal and bilateral Dental Injury: Teeth and Oropharynx as per pre-operative assessment

## 2019-10-06 ENCOUNTER — Encounter (HOSPITAL_COMMUNITY): Payer: Self-pay | Admitting: Urology

## 2020-01-09 ENCOUNTER — Ambulatory Visit: Payer: Managed Care, Other (non HMO) | Attending: Internal Medicine

## 2020-01-09 DIAGNOSIS — Z23 Encounter for immunization: Secondary | ICD-10-CM

## 2020-01-09 NOTE — Progress Notes (Signed)
   Covid-19 Vaccination Clinic  Name:  Kristin [name]    MRN: [id number] DOB: 04-Jul-1966  01/09/2020  [name] was observed post Covid-19 immunization for 15 minutes without incidence. She was provided with Vaccine Information Sheet and instruction to access the V-Safe system.   [name] was instructed to call 911 with any severe reactions post vaccine: Marland Kitchen Difficulty breathing  . Swelling of your face and throat  . A fast heartbeat  . A bad rash all over your body  . Dizziness and weakness    Immunizations Administered    Name Date Dose VIS Date Route   Pfizer COVID-19 Vaccine 01/09/2020  1:54 PM 0.3 mL 10/23/2019 Intramuscular   Manufacturer: [employer], [employer]   Lot: [id number]   NDC: [address]-0

## 2020-01-30 ENCOUNTER — Ambulatory Visit: Payer: Managed Care, Other (non HMO) | Attending: Internal Medicine

## 2020-01-30 DIAGNOSIS — Z23 Encounter for immunization: Secondary | ICD-10-CM

## 2020-01-30 NOTE — Progress Notes (Signed)
   Covid-19 Vaccination Clinic  Name:  Ande Therrell    MRN: [id number] DOB: 09/30/1966  01/30/2020  Ms. Faughnan was observed post Covid-19 immunization for 15 minutes without incident. She was provided with Vaccine Information Sheet and instruction to access the V-Safe system.   Ms. Goffredo was instructed to call 911 with any severe reactions post vaccine: Marland Kitchen Difficulty breathing  . Swelling of face and throat  . A fast heartbeat  . A bad rash all over body  . Dizziness and weakness   Immunizations Administered    Name Date Dose VIS Date Route   Pfizer COVID-19 Vaccine 01/30/2020  8:56 AM 0.3 mL 10/23/2019 Intramuscular   Manufacturer: [employer], [employer]   Lot: [id number]   NDC: [address]-5

## 2020-10-18 ENCOUNTER — Encounter (HOSPITAL_COMMUNITY): Payer: Self-pay | Admitting: Emergency Medicine

## 2020-10-18 ENCOUNTER — Emergency Department (HOSPITAL_COMMUNITY): Payer: Managed Care, Other (non HMO)

## 2020-10-18 ENCOUNTER — Other Ambulatory Visit: Payer: Self-pay

## 2020-10-18 ENCOUNTER — Emergency Department (HOSPITAL_COMMUNITY)
Admission: EM | Admit: 2020-10-18 | Discharge: 2020-10-18 | Disposition: A | Discharge status: Home / Self Care | Payer: Managed Care, Other (non HMO) | Attending: Emergency Medicine | Admitting: Emergency Medicine

## 2020-10-18 DIAGNOSIS — Z7982 Long term (current) use of aspirin: Secondary | ICD-10-CM | POA: Insufficient documentation

## 2020-10-18 DIAGNOSIS — R519 Headache, unspecified: Secondary | ICD-10-CM | POA: Insufficient documentation

## 2020-10-18 DIAGNOSIS — R030 Elevated blood-pressure reading, without diagnosis of hypertension: Secondary | ICD-10-CM | POA: Insufficient documentation

## 2020-10-18 DIAGNOSIS — Z87891 Personal history of nicotine dependence: Secondary | ICD-10-CM | POA: Insufficient documentation

## 2020-10-18 DIAGNOSIS — R079 Chest pain, unspecified: Secondary | ICD-10-CM | POA: Diagnosis not present

## 2020-10-18 LAB — CBC
HCT: 44.1 % (ref 36.0–46.0)
Hemoglobin: 15.1 g/dL — ABNORMAL HIGH (ref 12.0–15.0)
MCH: 32.1 pg (ref 26.0–34.0)
MCHC: 34.2 g/dL (ref 30.0–36.0)
MCV: 93.6 fL (ref 80.0–100.0)
Platelets: 263 10*3/uL (ref 150–400)
RBC: 4.71 MIL/uL (ref 3.87–5.11)
RDW: 12.1 % (ref 11.5–15.5)
WBC: 5.7 10*3/uL (ref 4.0–10.5)
nRBC: 0 % (ref 0.0–0.2)

## 2020-10-18 LAB — COMPREHENSIVE METABOLIC PANEL
ALT: 35 U/L (ref 0–44)
AST: 28 U/L (ref 15–41)
Albumin: 4.8 g/dL (ref 3.5–5.0)
Alkaline Phosphatase: 107 U/L (ref 38–126)
Anion gap: 11 (ref 5–15)
BUN: 16 mg/dL (ref 6–20)
CO2: 27 mmol/L (ref 22–32)
Calcium: 10.4 mg/dL — ABNORMAL HIGH (ref 8.9–10.3)
Chloride: 102 mmol/L (ref 98–111)
Creatinine, Ser: 0.94 mg/dL (ref 0.44–1.00)
GFR, Estimated: >60 mL/min (ref >60)
Glucose, Bld: 101 mg/dL — ABNORMAL HIGH (ref 70–99)
Potassium: 4.5 mmol/L (ref 3.5–5.1)
Sodium: 140 mmol/L (ref 135–145)
Total Bilirubin: 0.6 mg/dL (ref 0.3–1.2)
Total Protein: 7.3 g/dL (ref 6.5–8.1)

## 2020-10-18 LAB — I-STAT BETA HCG BLOOD, ED (MC, WL, AP ONLY): I-stat hCG, quantitative: <5 m[IU]/mL (ref <5)

## 2020-10-18 LAB — D-DIMER, QUANTITATIVE: D-Dimer, Quant: 0.48 ug/mL-FEU (ref 0.00–0.50)

## 2020-10-18 LAB — TROPONIN I (HIGH SENSITIVITY)
Troponin I (High Sensitivity): <2 ng/L (ref <18)
Troponin I (High Sensitivity): <2 ng/L (ref <18)

## 2020-10-18 LAB — LIPASE, BLOOD: Lipase: 34 U/L (ref 11–51)

## 2020-10-18 MED ORDER — ALUM & MAG HYDROXIDE-SIMETH 200-200-20 MG/5ML PO SUSP
30.0000 mL | Freq: Once | ORAL | Status: AC
  Administered 2020-10-18: 30 mL via ORAL
  Filled 2020-10-18: qty 30

## 2020-10-18 MED ORDER — LIDOCAINE VISCOUS HCL 2 % MT SOLN
15.0000 mL | Freq: Once | OROMUCOSAL | Status: AC
  Administered 2020-10-18: 15 mL via ORAL
  Filled 2020-10-18: qty 15

## 2020-10-18 MED ORDER — LORAZEPAM 2 MG/ML IJ SOLN
1.0000 mg | Freq: Once | INTRAMUSCULAR | Status: AC
  Administered 2020-10-18: 1 mg via INTRAVENOUS
  Filled 2020-10-18: qty 1

## 2020-10-18 NOTE — ED Triage Notes (Signed)
Patient states she had high blood pressure last night, in the 150s and normally its in the 120s. Endorses epigastric chest pain, SOB, and dizziness states it feels like acid. Took a clonopin last night for anxiety, this is her normal.

## 2020-10-18 NOTE — Discharge Instructions (Signed)
As discussed, all of your labs are reassuring today.  Your CT head was negative for any acute abnormalities. The medication you are currently on called Vraylar can cause elevated blood pressure. Please discuss with the doctor who prescribed the medication. Please have your PCP recheck your blood pressure within the next week. Return to the ER for new or worsening symptoms.

## 2020-10-18 NOTE — ED Provider Notes (Signed)
Kristin [name]   CSN: [id number] Arrival date & time: 10/18/20  0840     History Chief Complaint  Patient presents with  . Chest Pain    Kristin [name] is a 54 y.o. female who presents emergency department chief complaint of chest pain and hypertension.  Patient states that she has no previous history of hypertension.  She went home from work last night and had a bad headache.  She noticed that her blood pressure was elevated which she is never had before.  She said her heart rate was high.  She took a Klonopin and Tylenol and went to bed.  This morning when she awoke she took her blood pressure again and states that it was 130/90.  When she got to work she had the nurse take it and said that it was elevated to 130/96 so she came to the emergency department.  She feels chest pain which she describes as symptoms of "reflux."  She has a history of reflux.  She takes Klonopin daily at night.  Occasionally she takes it for severe anxiety during the day.  She is on multiple antidepressant and and is currently on Vraylar which is a new medication.  She denies a history of hyperthyroidism.  She denies excessive caffeine use, polysubstance abuse.  She denies dependency on her Klonopin and she has a history of previous DVT after a long train ride.  She is currently taking 325 of aspirin daily.  HPI  HPI: A 54 year old patient with a history of hypercholesterolemia presents for evaluation of chest pain. Initial onset of pain was less than one hour ago. The patient's chest pain is not worse with exertion. The patient's chest pain is middle- or left-sided, is not well-localized, is not described as heaviness/pressure/tightness, is not sharp and does not radiate to the arms/jaw/neck. The patient does not complain of nausea and denies diaphoresis. The patient has no history of stroke, has no history of peripheral artery disease, has not smoked in the past 90 days,  denies any history of treated diabetes, has no relevant family history of coronary artery disease (first degree relative at less than age 64), is not hypertensive and does not have an elevated BMI (>=30).   Past Medical History:  Diagnosis Date  . Anxiety   . Balance problem   . Bilateral posterior subcapsular polar cataract   . Depression, major   . Dizziness   . DVT (deep venous thrombosis) (HCC)   . GERD (gastroesophageal reflux disease)   . Neuropathy   . Renal calculi     Patient Active Problem List   Diagnosis Date Noted  . Sepsis secondary to UTI (HCC) 09/19/2019  . Disturbance of skin sensation 03/04/2014  . Memory change 03/04/2014    Past Surgical History:  Procedure Laterality Date  . CESAREAN SECTION    . CYSTOSCOPY W/ URETERAL STENT PLACEMENT Right 09/19/2019   Procedure: CYSTOSCOPY WITH RETROGRADE PYELOGRAM/URETERAL STENT PLACEMENT;  Surgeon: Crista Elliot, MD;  Location: [facility];  Service: Urology;  Laterality: Right;  . CYSTOSCOPY/URETEROSCOPY/HOLMIUM LASER/STENT PLACEMENT Right 10/05/2019   Procedure: CYSTOSCOPY RIGHT RETROGRADE PYELOGRAM URETEROSCOPY/HOLMIUM LASER/STENT PLACEMENT;  Surgeon: Crista Elliot, MD;  Location: [facility];  Service: Urology;  Laterality: Right;  . LITHOTRIPSY       OB History   No obstetric history on file.     Family History  Problem Relation Age of Onset  . Depression Sister   . Cancer Sister  thyroid  . Multiple sclerosis Cousin     Social History   Tobacco Use  . Smoking status: Former Smoker    Quit date: 11/12/2018    Years since quitting: 1.9  . Smokeless tobacco: Never Used  Vaping Use  . Vaping Use: Former  Substance Use Topics  . Alcohol use: Yes    Comment: Two drinks about every 2-4 weeks   . Drug use: No    Home Medications Prior to Admission medications   Medication Sig Start Date End Date Taking? Authorizing Provider  acetaminophen (TYLENOL) 500 MG tablet Take 500 mg by mouth every 6  (six) hours as needed for moderate pain.    [provider]  ARIPiprazole (ABILIFY) 10 MG tablet Take 10 mg by mouth daily.    [provider]  Ascorbic Acid (VITAMIN C) 1000 MG tablet Take 2,000 mg by mouth daily.    [provider]  aspirin 325 MG tablet Take 325 mg by mouth daily.    [provider]  buPROPion (WELLBUTRIN XL) 300 MG 24 hr tablet Take 300 mg by mouth daily.  02/26/17   [provider]  clonazePAM (KLONOPIN) 1 MG tablet Take 2 mg by mouth at bedtime.    [provider]  docusate sodium (COLACE) 100 MG capsule Take 100 mg by mouth daily.     [provider]  lamoTRIgine (LAMICTAL) 100 MG tablet Take 150 mg by mouth daily.  03/03/14   [provider]  meloxicam (MOBIC) 15 MG tablet Take 15 mg by mouth daily as needed for pain.  04/20/19   [provider]  mirabegron ER (MYRBETRIQ) 25 MG TB24 tablet Take 1 tablet (25 mg total) by mouth daily for 10 days. Patient not taking: Reported on 10/01/2019 09/24/19 10/04/19  Lorin Glassahal, Bin[place], MD  Multiple Vitamin (MULTIVITAMIN) tablet Take 1 tablet by mouth daily.    [provider]  Phenazopyridine HCl (PYRIDIUM PO) Take by mouth.    [provider]  TAMSULOSIN HCL PO Take by mouth.    [provider]  VITAMIN D PO Take by mouth.    [provider]    Allergies    Augmentin [amoxicillin-pot clavulanate]  Review of Systems   Review of Systems Ten systems reviewed and are negative for acute change, except as noted in the HPI.   Physical Exam Updated Vital Signs BP (!) 144/103 (BP Location: Left Arm)   Pulse 94   Temp 98.5 F (36.9 C) (Oral)   Resp 18   Ht 5\' 5"  (1.651 m)   Wt 68 kg   SpO2 100%   BMI 24.96 kg/m   Physical Exam Vitals and nursing [name] reviewed.  Constitutional:      General: She is not in acute distress.    Appearance: She is well-developed. She is not diaphoretic.  HENT:     Head: Normocephalic  and atraumatic.  Eyes:     General: No scleral icterus.    Conjunctiva/sclera: Conjunctivae normal.  Cardiovascular:     Rate and Rhythm: Normal rate and regular rhythm.     Heart sounds: Normal heart sounds. No murmur heard.  No friction rub. No gallop.   Pulmonary:     Effort: Pulmonary effort is normal. No respiratory distress.     Breath sounds: Normal breath sounds.  Abdominal:     General: Bowel sounds are normal. There is no distension.     Palpations: Abdomen is soft. There is no mass.  Tenderness: There is no abdominal tenderness. There is no guarding.  Musculoskeletal:     Cervical back: Normal range of motion.  Skin:    General: Skin is warm and dry.  Neurological:     Mental Status: She is alert and oriented to person, place, and time.  Psychiatric:        Behavior: Behavior normal.     ED Results / Procedures / Treatments   Labs (all labs ordered are listed, but only abnormal results are displayed) Labs Reviewed  CBC - Abnormal; Notable for the following components:      Result Value   Hemoglobin 15.1 (*)    All other components within normal limits  COMPREHENSIVE METABOLIC PANEL  LIPASE, BLOOD  D-DIMER, QUANTITATIVE (NOT AT [facility])  I-STAT BETA HCG BLOOD, ED ([facility], [facility], [facility] ONLY)  TROPONIN I (HIGH SENSITIVITY)    EKG EKG Interpretation  Date/Time:  Tuesday October 18 2020 08:49:23 EST Ventricular Rate:  92 PR Interval:    QRS Duration: 82 QT Interval:  349 QTC Calculation: 432 R Axis:   73 Text Interpretation: Sinus rhythm 12 Lead; Mason-Likar Confirmed by Kennis Carina [phone]) on 10/18/2020 9:54:34 AM   Radiology DG Chest 2 View  Result Date: 10/18/2020 CLINICAL DATA:  Chest pain, shortness of breath EXAM: CHEST - 2 VIEW COMPARISON:  09/19/2019 FINDINGS: Lingular scarring. Right lung clear. No effusions. Heart is normal size. No acute bony abnormality. IMPRESSION: No active cardiopulmonary disease. Electronically Signed   By: Charlett Nose M.D.    On: 10/18/2020 09:09    Procedures Procedures (including critical care time)  Medications Ordered in ED Medications - No data to display  ED Course  I have reviewed the triage vital signs and the nursing notes.  Pertinent labs & imaging results that were available during my care of the patient were reviewed by me and considered in my medical decision making (see chart for details).  Clinical Course as of Oct 19 821  Tue Oct 18, 2020  1054 D-Dimer, Quant: 0.48 [AH]    Clinical Course User Index [AH] Arthor Captain, PA-C   MDM Rules/Calculators/A&P HEAR Score: 2                        Patient here with multiple complaints. She complained of a headache last night but chiefly complained of hypertension and chest pain.The emergent differential diagnosis of chest pain includes: Acute coronary syndrome, pericarditis, aortic dissection, pulmonary embolism, tension pneumothorax, pneumonia, and esophageal rupture. I discussed the patients risk factors, cp work up and scope of practice regarding hypertension to hypertensive emergencies. I discussed that I would review her medications. (2-5% of patients on Vraylar do get hypertension).  Later her nurse reported that she felt as though we "got off on the wrong foot" and asked to see "the MD." She was seen be Dr. Pilar Plate and began complaining of her headache. He has ordered a CT which is currently pending  I ordered Labs which included CBC which is w/o sig abnormality. Non-fasted CMP with glucose of 101- otherwise wnl. Negative Lipase. Hcg, d-dimer. I reviewed 2 v cxr which is w/out abnormality. EKG shows NSR at 82 bpm. Though she is HDS, she is persistently hypertensive despite dose of IV ativan. ? Medication side effect. I have lower suspicion for hyperthyroidism given her normal HR. Other possibilities include renal artery stenosis, adrenoma or pheochromocytoma. She does not have any sign of hypertensive emergency, acs, scad, or other cardiac  abnormality.  Further investigation is outpatient appropriate. Her CT head is pending and I have low suspicion for any abnormality especially since she is HA free at this time without neuro abnormality. I have given sign out to PA Aberman and expect dc. She may f/u with her pcp.   Final Clinical Impression(s) / ED Diagnoses Final diagnoses:  None    Rx / DC Orders ED Discharge Orders    None       Arthor Captain, PA-C 10/19/20 [id number]    Sabas Sous, MD 10/19/20 [phone]

## 2020-10-18 NOTE — ED Provider Notes (Signed)
Patient received from Arthor Captain, PA-C at shift change pending CT head. See her note for full HPI.  In short, patient is a 54 year old female who presents to the ED due to concerns about hypertension and chest pain.  No previous history of hypertension.  Patient notes her BP has been mildly elevated for the past 24 hours associated with a headache last night.  She also admits to chest pain which she describes as "reflux".  Patient admits having history of GERD. She recently started Vraylar.  Patient midst of a previous history of DVT after a long train ride.  Denies lower extremity edema.  Plan from previous provider: If CT head and delta troponins normal, patient may be discharged home with PCP follow-up for BP recheck and discuss possible medication side effect of Vraylar.   ED Course/Procedures   Results for orders placed or performed during the hospital encounter of 10/18/20 (from the past 24 hour(s))  Troponin I (High Sensitivity)     Status: None   Collection Time: 10/18/20  9:26 AM  Result Value Ref Range   Troponin I (High Sensitivity) <2 <18 ng/L  Comprehensive metabolic panel     Status: Abnormal   Collection Time: 10/18/20  9:26 AM  Result Value Ref Range   Sodium 140 135 - 145 mmol/L   Potassium 4.5 3.5 - 5.1 mmol/L   Chloride 102 98 - 111 mmol/L   CO2 27 22 - 32 mmol/L   Glucose, Bld 101 (H) 70 - 99 mg/dL   BUN 16 6 - 20 mg/dL   Creatinine, Ser 7.86 0.44 - 1.00 mg/dL   Calcium 76.7 (H) 8.9 - 10.3 mg/dL   Total Protein 7.3 6.5 - 8.1 g/dL   Albumin 4.8 3.5 - 5.0 g/dL   AST 28 15 - 41 U/L   ALT 35 0 - 44 U/L   Alkaline Phosphatase 107 38 - 126 U/L   Total Bilirubin 0.6 0.3 - 1.2 mg/dL   GFR, Estimated >20 >94 mL/min   Anion gap 11 5 - 15  Lipase, blood     Status: None   Collection Time: 10/18/20  9:26 AM  Result Value Ref Range   Lipase 34 11 - 51 U/L  I-Stat beta hCG blood, ED     Status: None   Collection Time: 10/18/20  9:34 AM  Result Value Ref Range   I-stat  hCG, quantitative <5.0 <5 mIU/mL   Comment 3          CBC     Status: Abnormal   Collection Time: 10/18/20  9:40 AM  Result Value Ref Range   WBC 5.7 4.0 - 10.5 K/uL   RBC 4.71 3.87 - 5.11 MIL/uL   Hemoglobin 15.1 (H) 12.0 - 15.0 g/dL   HCT 70.9 36 - 46 %   MCV 93.6 80.0 - 100.0 fL   MCH 32.1 26.0 - 34.0 pg   MCHC 34.2 30.0 - 36.0 g/dL   RDW 62.8 36.6 - 29.4 %   Platelets 263 150 - 400 K/uL   nRBC 0.0 0.0 - 0.2 %  D-dimer, quantitative (not at [facility])     Status: None   Collection Time: 10/18/20  9:51 AM  Result Value Ref Range   D-Dimer, Quant 0.48 0.00 - 0.50 ug/mL-FEU  Troponin I (High Sensitivity)     Status: None   Collection Time: 10/18/20 11:42 AM  Result Value Ref Range   Troponin I (High Sensitivity) <2 <18 ng/L    Clinical Course  as of Oct 18 1534  Tue Oct 18, 2020  1054 D-Dimer, Quant: 0.48 [AH]    Clinical Course User Index [AH] Arthor Captain, PA-C    Procedures  MDM  Patient received from Arthor Captain, PA-C at shift change pending CT head and delta troponins.  See her note for full MDM.  54 year old female presents the ED with concerns of elevated BP, headache, and chest pain.  Patient has a history of previous DVT after a long train ride. Currently on ASA, but no other anticoagulants. Upon arrival, patient is afebrile, not tachycardic or hypoxic.  Patient's BP elevated here in the ED in 140s/90s. I have personally reviewed all lab work and imaging ordered from previous provider.  D-dimer normal.  Doubt PE/DVT.  Delta troponin flat.  Low suspicion for ACS.  Lipase normal at 34.  Doubt pancreatitis.  CBC reassuring with no leukocytosis.  CMP reassuring with normal renal function no major electrolyte derangements.  Pregnancy test negative.  CT head personally reviewed which is negative for any acute abnormalities.  Low suspicion for hypertensive urgency/emergency. Chest x-ray personally reviewed which is negative for any signs of pneumonia, pneumothorax, or widened  mediastinum.  EKG personally reviewed which demonstrates normal sinus rhythm with no signs of acute ischemia.  Aortic dissection was considered, but thought to be less likely given patient's presentation. Suspect chest pain related to possible GERD given relief of symptoms after GI cocktail. Instructed patient to follow-up with PCP within the next week for BP recheck. Discussed with patient possible Vraylar side effects and to discuss with prescribing physician. Strict ED precautions discussed with patient. Patient states understanding and agrees to plan. Patient discharged home in no acute distress and stable vitals.    Mannie Stabile, PA-C 10/18/20 1618    Sabino Donovan, MD 10/22/20 [phone]

## 2021-11-07 ENCOUNTER — Other Ambulatory Visit: Payer: Self-pay

## 2021-11-07 ENCOUNTER — Ambulatory Visit
Admission: EM | Admit: 2021-11-07 | Discharge: 2021-11-07 | Disposition: A | Discharge status: Home / Self Care | Payer: Managed Care, Other (non HMO) | Attending: Emergency Medicine | Admitting: Emergency Medicine

## 2021-11-07 DIAGNOSIS — R82998 Other abnormal findings in urine: Secondary | ICD-10-CM | POA: Diagnosis not present

## 2021-11-07 DIAGNOSIS — R102 Pelvic and perineal pain: Secondary | ICD-10-CM | POA: Insufficient documentation

## 2021-11-07 DIAGNOSIS — R110 Nausea: Secondary | ICD-10-CM | POA: Diagnosis present

## 2021-11-07 DIAGNOSIS — R1024 Suprapubic pain: Secondary | ICD-10-CM

## 2021-11-07 DIAGNOSIS — R350 Frequency of micturition: Secondary | ICD-10-CM

## 2021-11-07 LAB — POCT URINALYSIS DIP (MANUAL ENTRY)
Bilirubin, UA: NEGATIVE
Blood, UA: NEGATIVE
Glucose, UA: NEGATIVE mg/dL
Ketones, POC UA: NEGATIVE mg/dL
Nitrite, UA: NEGATIVE
Protein Ur, POC: NEGATIVE mg/dL
Spec Grav, UA: 1.015 (ref 1.010–1.025)
Urobilinogen, UA: 0.2 E.U./dL
pH, UA: 7 (ref 5.0–8.0)

## 2021-11-07 LAB — POCT URINE PREGNANCY: Preg Test, Ur: NEGATIVE

## 2021-11-07 MED ORDER — ONDANSETRON 8 MG PO TBDP: 8.0000 mg | ORAL_TABLET | Freq: Three times a day (TID) | ORAL | 0 refills | Status: AC | PRN

## 2021-11-07 MED ORDER — SULFAMETHOXAZOLE-TRIMETHOPRIM 800-160 MG PO TABS
1.0000 | ORAL_TABLET | Freq: Two times a day (BID) | ORAL | 0 refills | Status: AC
Start: 2021-11-07 — End: 2021-11-12

## 2021-11-07 MED ORDER — ONDANSETRON HCL 4 MG/2ML IJ SOLN: 8.0000 mg | Freq: Once | INTRAMUSCULAR | Status: DC

## 2021-11-07 MED ORDER — ONDANSETRON 8 MG PO TBDP
8.0000 mg | ORAL_TABLET | Freq: Once | ORAL | Status: AC
  Administered 2021-11-07: 12:00:00 8 mg via ORAL

## 2021-11-07 NOTE — ED Provider Notes (Signed)
[facility]    CSN: [id number] Arrival date & time: 11/07/21  1102    HISTORY   Chief Complaint  Patient presents with   Nausea   HPI Kristin [name] is a 55 y.o. female. Patient complains of suprapubic pressure and some mild burning with urination that began early last week, got better then got worse again prompting her to come in today.  Patient says she is also been experiencing significant nausea but has not been vomiting.  Patient reports a history of diverticulosis and is wondering if the suprapubic pain she is having right now is due to the diverticulosis or due to a urinary tract infection.  Urine dip today revealed trace leukocytosis.  The history is provided by the patient.  Past Medical History:  Diagnosis Date   Anxiety    Balance problem    Bilateral posterior subcapsular polar cataract    Depression, major    Dizziness    DVT (deep venous thrombosis) (HCC)    GERD (gastroesophageal reflux disease)    Neuropathy    Renal calculi    Patient Active Problem List   Diagnosis Date Noted   Sepsis secondary to UTI (HCC) 09/19/2019   Disturbance of skin sensation 03/04/2014   Memory change 03/04/2014   Past Surgical History:  Procedure Laterality Date   CESAREAN SECTION     CYSTOSCOPY W/ URETERAL STENT PLACEMENT Right 09/19/2019   Procedure: CYSTOSCOPY WITH RETROGRADE PYELOGRAM/URETERAL STENT PLACEMENT;  Surgeon: Crista Elliot, MD;  Location: [facility];  Service: Urology;  Laterality: Right;   CYSTOSCOPY/URETEROSCOPY/HOLMIUM LASER/STENT PLACEMENT Right 10/05/2019   Procedure: CYSTOSCOPY RIGHT RETROGRADE PYELOGRAM URETEROSCOPY/HOLMIUM LASER/STENT PLACEMENT;  Surgeon: Crista Elliot, MD;  Location: [facility];  Service: Urology;  Laterality: Right;   LITHOTRIPSY     OB History   No obstetric history on file.    Home Medications    Prior to Admission medications   Medication Sig Start Date End Date Taking? Authorizing Provider  ondansetron  (ZOFRAN-ODT) 8 MG disintegrating tablet Take 1 tablet (8 mg total) by mouth every 8 (eight) hours as needed for nausea or vomiting. 11/07/21  Yes Theadora Rama Scales, PA-C  sulfamethoxazole-trimethoprim (BACTRIM DS) 800-160 MG tablet Take 1 tablet by mouth 2 (two) times daily for 5 days. 11/07/21 11/12/21 Yes Theadora Rama Scales, PA-C  acetaminophen (TYLENOL) 500 MG tablet Take 500 mg by mouth every 6 (six) hours as needed for moderate pain.    [provider]  Ascorbic Acid (VITAMIN C) 1000 MG tablet Take 1,000 mg by mouth daily.     [provider]  aspirin 325 MG tablet Take 325 mg by mouth daily.    [provider]  buPROPion (WELLBUTRIN XL) 300 MG 24 hr tablet Take 300 mg by mouth daily.  02/26/17   [provider]  clonazePAM (KLONOPIN) 1 MG tablet Take 2 mg by mouth at bedtime.    [provider]  meloxicam (MOBIC) 15 MG tablet Take 15 mg by mouth daily as needed for pain.  04/20/19   [provider]  Multiple Vitamin (MULTIVITAMIN) tablet Take 1 tablet by mouth daily.    [provider]  omeprazole (PRILOSEC) 40 MG capsule Take 40 mg by mouth daily. 08/27/20   [provider]  VITAMIN D PO Take 4,000 Units by mouth daily.     [provider]  VRAYLAR capsule Take 1.5 mg by mouth daily. 09/26/20   [provider]   Family History Family History  Problem Relation Age of Onset   Depression Sister    Cancer Sister        thyroid   Multiple sclerosis Cousin    Social History Social History   Tobacco Use   Smoking status: Former    Types: Cigarettes    Quit date: 11/12/2018    Years since quitting: 2.9   Smokeless tobacco: Never  Vaping Use   Vaping Use: Former  Substance Use Topics   Alcohol use: Yes    Comment: Two drinks about every 2-4 weeks    Drug use: No   Allergies   Augmentin [amoxicillin-pot clavulanate]  Review of Systems Review of Systems Pertinent findings noted in history  of present illness.   Physical Exam Triage Vital Signs ED Triage Vitals  Enc Vitals Group     BP 09/08/21 0827 (!) 147/82     Pulse Rate 09/08/21 0827 72     Resp 09/08/21 0827 18     Temp 09/08/21 0827 98.3 F (36.8 C)     Temp Source 09/08/21 0827 Oral     SpO2 09/08/21 0827 98 %     Weight --      Height --      Head Circumference --      Peak Flow --      Pain Score 09/08/21 0826 5     Pain Loc --      Pain Edu? --      Excl. in GC? --   No data found.  Updated Vital Signs BP 126/83 (BP Location: Right Arm)    Pulse 77    Temp 98.9 F (37.2 C) (Oral)    Resp 16    SpO2 100%   Physical Exam Vitals and nursing note reviewed.  Constitutional:      General: She is not in acute distress.    Appearance: Normal appearance. She is not ill-appearing.  HENT:     Head: Normocephalic and atraumatic.  Eyes:     General: Lids are normal.        Right eye: No discharge.        Left eye: No discharge.     Extraocular Movements: Extraocular movements intact.     Conjunctiva/sclera: Conjunctivae normal.     Right eye: Right conjunctiva is not injected.     Left eye: Left conjunctiva is not injected.  Neck:     Trachea: Trachea and phonation normal.  Cardiovascular:     Rate and Rhythm: Normal rate and regular rhythm.     Pulses: Normal pulses.     Heart sounds: Normal heart sounds. No murmur heard.   No friction rub. No gallop.  Pulmonary:     Effort: Pulmonary effort is normal. No accessory muscle usage, prolonged expiration or respiratory distress.     Breath sounds: Normal breath sounds. No stridor, decreased air movement or transmitted upper airway sounds. No decreased breath sounds, wheezing, rhonchi or rales.  Chest:     Chest wall: No tenderness.  Abdominal:     General: Abdomen is flat. Bowel sounds are normal. There is no distension.     Palpations: Abdomen is soft.     Tenderness: There is abdominal tenderness in the suprapubic area. There is no right CVA  tenderness or left CVA tenderness.     Hernia: No hernia is present.  Musculoskeletal:        General: Normal range of motion.     Cervical back: Normal range of motion and neck  supple. Normal range of motion.  Lymphadenopathy:     Cervical: No cervical adenopathy.  Skin:    General: Skin is warm and dry.     Findings: No erythema or rash.  Neurological:     General: No focal deficit present.     Mental Status: She is alert and oriented to person, place, and time.  Psychiatric:        Mood and Affect: Mood normal.        Behavior: Behavior normal.    Visual Acuity Right Eye Distance:   Left Eye Distance:   Bilateral Distance:    Right Eye Near:   Left Eye Near:    Bilateral Near:     [facility] Couse / Diagnostics / Procedures:    EKG  Radiology No results found.  Procedures Procedures (including critical care time)  UC Diagnoses / Final Clinical Impressions(s)   I have reviewed the triage vital signs and the nursing notes.  Pertinent labs & imaging results that were available during my care of the patient were reviewed by me and considered in my medical decision making (see chart for details).    Final diagnoses:  Nausea without vomiting  Urine leukocytes increased  Increased frequency of urination  Suprapubic pressure   Urine dip today was abnormal.  Urine culture will be performed per our protocol.  Patient advised that they will be contacted with results and that adjustments to treatment will be provided as indicated based on the results.    Patient was advised of possibility that urine culture results may be negative if sample provided was obtained late in the day causing urine to be more diluted.  Patient was advised that if antibiotics were effective after the first 24 to 36 hours, despite negative urine culture result, it is recommended that they complete the full course as prescribed.  Return precautions advised.  Drug allergies reviewed, all questions addressed.   Also advised to continue bland diet until symptoms improve.  ED Prescriptions     Medication Sig Dispense Auth. Provider   sulfamethoxazole-trimethoprim (BACTRIM DS) 800-160 MG tablet Take 1 tablet by mouth 2 (two) times daily for 5 days. 10 tablet Theadora Rama Scales, PA-C   ondansetron (ZOFRAN-ODT) 8 MG disintegrating tablet Take 1 tablet (8 mg total) by mouth every 8 (eight) hours as needed for nausea or vomiting. 20 tablet Theadora Rama Scales, PA-C      PDMP not reviewed this encounter.  Pending results:  Labs Reviewed  POCT URINALYSIS DIP (MANUAL ENTRY) - Abnormal; Notable for the following components:      Result Value   Leukocytes, UA Trace (*)    All other components within normal limits  URINE CULTURE  POCT URINE PREGNANCY    Medications Ordered in UC: Medications  ondansetron (ZOFRAN-ODT) disintegrating tablet 8 mg (8 mg Oral Given 11/07/21 1227)    Disposition Upon Discharge:  Condition: stable for discharge home Home: take medications as prescribed; routine discharge instructions as discussed; follow up as advised.  Patient presented with an acute illness with associated systemic symptoms and significant discomfort requiring urgent management. In my opinion, this is a condition that a prudent lay person (someone who possesses an average knowledge of health and medicine) may potentially expect to result in complications if not addressed urgently such as respiratory distress, impairment of bodily function or dysfunction of bodily organs.   Routine symptom specific, illness specific and/or disease specific instructions were discussed with the patient and/or caregiver at length.  As such, the patient has been evaluated and assessed, work-up was performed and treatment was provided in alignment with urgent care protocols and evidence based medicine.  Patient/parent/caregiver has been advised that the patient may require follow up for further testing and treatment if  the symptoms continue in spite of treatment, as clinically indicated and appropriate.  Patient/parent/caregiver has been advised to return to the [facility] or PCP in 3-5 days if no better; to PCP or the Emergency Department if new signs and symptoms develop, or if the current signs or symptoms continue to change or worsen for further workup, evaluation and treatment as clinically indicated and appropriate  The patient will follow up with their current PCP if and as advised. If the patient does not currently have a PCP we will assist them in obtaining one.   The patient may need specialty follow up if the symptoms continue, in spite of conservative treatment and management, for further workup, evaluation, consultation and treatment as clinically indicated and appropriate.  Patient/parent/caregiver verbalized understanding and agreement of plan as discussed.  All questions were addressed during visit.  Please see discharge instructions below for further details of plan.  Discharge Instructions:   Discharge Instructions      The urinalysis that we performed today was abnormal.  Urine culture will be performed per our protocol.  The results of urine culture should be available in the next 3 to 5 days and will be posted to your MyChart account.  If there are any abnormal results, you will be contacted by phone and if further treatment is needed, this will be provided for you.  If you were advised to begin antibiotics today, it is because you are having active symptoms of a urinary tract infection.  It is important that you complete the course of antibiotics as prescribed despite the results of your urine culture.    The most common reason for a negative urine culture, despite having active symptoms of urinary tract infection, is that a truly reliable urine sample can only be obtained with the first urination of the day.  That early morning urine sample is very concentrated so it is most likely to have a  significant amount of bacteria, enough to provide a positive urine culture.  Throughout the day, as you drink fluids and consume foods, your urine becomes more diluted.  Diluted urine decreases the likelihood of a negative urine culture.  Therefore, if you begin to have significant relief of your symptoms when you begin the antibiotics but receive a phone call stating that your urine culture was negative, please trust the improvement of your symptoms as a sign that the antibiotics have been effective and that you should complete the entire course.  For your nausea, provided you with a prescription for Zofran 8 mg in the form of an orally disintegrating tablet for ease of use.  I recommend that you consider adhering to a clear liquid diet is much as possible for the next few days just to give your bowels a chance to rest and recover if they are currently inflamed.  Please feel free to return for repeat evaluation later this week or early next week if you have not had significant improvement of your symptoms.    This office note has been dictated using Teaching laboratory technician.  Unfortunately, and despite my best efforts, this method of dictation can sometimes lead to occasional typographical or grammatical errors.  I apologize in advance if this occurs.  Theadora Rama Scales, PA-C 11/07/21 1410

## 2021-11-07 NOTE — ED Triage Notes (Signed)
Pt presents to the office for dysuria and nausea for several days. She is requesting some Zofran for her nausea symptoms.

## 2021-11-07 NOTE — Discharge Instructions (Signed)
The urinalysis that we performed today was abnormal.  Urine culture will be performed per our protocol.  The results of urine culture should be available in the next 3 to 5 days and will be posted to your MyChart account.  If there are any abnormal results, you will be contacted by phone and if further treatment is needed, this will be provided for you.  If you were advised to begin antibiotics today, it is because you are having active symptoms of a urinary tract infection.  It is important that you complete the course of antibiotics as prescribed despite the results of your urine culture.    The most common reason for a negative urine culture, despite having active symptoms of urinary tract infection, is that a truly reliable urine sample can only be obtained with the first urination of the day.  That early morning urine sample is very concentrated so it is most likely to have a significant amount of bacteria, enough to provide a positive urine culture.  Throughout the day, as you drink fluids and consume foods, your urine becomes more diluted.  Diluted urine decreases the likelihood of a negative urine culture.  Therefore, if you begin to have significant relief of your symptoms when you begin the antibiotics but receive a phone call stating that your urine culture was negative, please trust the improvement of your symptoms as a sign that the antibiotics have been effective and that you should complete the entire course.  For your nausea, provided you with a prescription for Zofran 8 mg in the form of an orally disintegrating tablet for ease of use.  I recommend that you consider adhering to a clear liquid diet is much as possible for the next few days just to give your bowels a chance to rest and recover if they are currently inflamed.  Please feel free to return for repeat evaluation later this week or early next week if you have not had significant improvement of your symptoms.

## 2021-11-08 LAB — URINE CULTURE
